# Patient Record
Sex: Female | Born: 2000 | Race: White | Hispanic: No | Marital: Single | Smoking: Never smoker
Health system: Southern US, Community
[De-identification: ages and names within clinical notes are randomized; demographics above are authoritative.]

## PROBLEM LIST (undated history)

## (undated) DIAGNOSIS — F419 Anxiety disorder, unspecified: Secondary | ICD-10-CM

## (undated) HISTORY — DX: Anxiety disorder, unspecified: F41.9

---

## 2020-12-22 ENCOUNTER — Emergency Department
Admission: EM | Admit: 2020-12-22 | Discharge: 2020-12-22 | Disposition: A | Discharge status: Home / Self Care | Payer: Self-pay | Attending: Emergency Medicine | Admitting: Emergency Medicine

## 2020-12-22 ENCOUNTER — Encounter: Payer: Self-pay | Admitting: Emergency Medicine

## 2020-12-22 ENCOUNTER — Other Ambulatory Visit: Payer: Self-pay

## 2020-12-22 DIAGNOSIS — R197 Diarrhea, unspecified: Secondary | ICD-10-CM

## 2020-12-22 DIAGNOSIS — F12188 Cannabis abuse with other cannabis-induced disorder: Secondary | ICD-10-CM | POA: Insufficient documentation

## 2020-12-22 DIAGNOSIS — F1290 Cannabis use, unspecified, uncomplicated: Secondary | ICD-10-CM

## 2020-12-22 DIAGNOSIS — E860 Dehydration: Secondary | ICD-10-CM | POA: Insufficient documentation

## 2020-12-22 DIAGNOSIS — R112 Nausea with vomiting, unspecified: Secondary | ICD-10-CM

## 2020-12-22 LAB — COMPREHENSIVE METABOLIC PANEL
ALT: 14 U/L (ref 0–44)
AST: 25 U/L (ref 15–41)
Albumin: 4.4 g/dL (ref 3.5–5.0)
Alkaline Phosphatase: 33 U/L — ABNORMAL LOW (ref 38–126)
Anion gap: 12 (ref 5–15)
BUN: 16 mg/dL (ref 6–20)
CO2: 20 mmol/L — ABNORMAL LOW (ref 22–32)
Calcium: 9.4 mg/dL (ref 8.9–10.3)
Chloride: 103 mmol/L (ref 98–111)
Creatinine, Ser: 0.78 mg/dL (ref 0.44–1.00)
GFR, Estimated: >60 mL/min (ref >60)
Glucose, Bld: 176 mg/dL — ABNORMAL HIGH (ref 70–99)
Potassium: 4 mmol/L (ref 3.5–5.1)
Sodium: 135 mmol/L (ref 135–145)
Total Bilirubin: 1.2 mg/dL (ref 0.3–1.2)
Total Protein: 8.3 g/dL — ABNORMAL HIGH (ref 6.5–8.1)

## 2020-12-22 LAB — CBC WITH DIFFERENTIAL/PLATELET
Abs Immature Granulocytes: 0.03 10*3/uL (ref 0.00–0.07)
Basophils Absolute: 0 10*3/uL (ref 0.0–0.1)
Basophils Relative: 0 %
Eosinophils Absolute: 0 10*3/uL (ref 0.0–0.5)
Eosinophils Relative: 0 %
HCT: 39.6 % (ref 36.0–46.0)
Hemoglobin: 14 g/dL (ref 12.0–15.0)
Immature Granulocytes: 0 %
Lymphocytes Relative: 4 %
Lymphs Abs: 0.4 10*3/uL — ABNORMAL LOW (ref 0.7–4.0)
MCH: 33.2 pg (ref 26.0–34.0)
MCHC: 35.4 g/dL (ref 30.0–36.0)
MCV: 93.8 fL (ref 80.0–100.0)
Monocytes Absolute: 0.3 10*3/uL (ref 0.1–1.0)
Monocytes Relative: 3 %
Neutro Abs: 10.3 10*3/uL — ABNORMAL HIGH (ref 1.7–7.7)
Neutrophils Relative %: 93 %
Platelets: 298 10*3/uL (ref 150–400)
RBC: 4.22 MIL/uL (ref 3.87–5.11)
RDW: 13.1 % (ref 11.5–15.5)
WBC: 11.1 10*3/uL — ABNORMAL HIGH (ref 4.0–10.5)
nRBC: 0 % (ref 0.0–0.2)

## 2020-12-22 LAB — URINALYSIS, COMPLETE (UACMP) WITH MICROSCOPIC
Bilirubin Urine: NEGATIVE
Glucose, UA: >=500 mg/dL — AB
Hgb urine dipstick: NEGATIVE
Ketones, ur: 80 mg/dL — AB
Leukocytes,Ua: NEGATIVE
Nitrite: NEGATIVE
Protein, ur: 30 mg/dL — AB
Specific Gravity, Urine: 1.028 (ref 1.005–1.030)
pH: 8 (ref 5.0–8.0)

## 2020-12-22 LAB — POC URINE PREG, ED: Preg Test, Ur: NEGATIVE

## 2020-12-22 LAB — LIPASE, BLOOD: Lipase: 22 U/L (ref 11–51)

## 2020-12-22 MED ORDER — ONDANSETRON HCL 4 MG/2ML IJ SOLN: 4.0000 mg | Freq: Once | INTRAMUSCULAR | Status: DC

## 2020-12-22 MED ORDER — ONDANSETRON 4 MG PO TBDP: 4.0000 mg | ORAL_TABLET | Freq: Three times a day (TID) | ORAL | 0 refills | Status: DC | PRN

## 2020-12-22 MED ORDER — LACTATED RINGERS IV BOLUS
1000.0000 mL | Freq: Once | INTRAVENOUS | Status: AC
  Administered 2020-12-22: 1000 mL via INTRAVENOUS

## 2020-12-22 MED ORDER — DROPERIDOL 2.5 MG/ML IJ SOLN
2.5000 mg | Freq: Once | INTRAMUSCULAR | Status: AC
  Administered 2020-12-22: 2.5 mg via INTRAVENOUS
  Filled 2020-12-22: qty 2

## 2020-12-22 NOTE — ED Triage Notes (Signed)
Pt reports NVD that started last pm and abd pain to the center of her abd. Pt reports can't keep anything down and the pain is worse when she vomits.

## 2020-12-22 NOTE — ED Notes (Signed)
Pt tolerating oral fluids 

## 2020-12-22 NOTE — ED Provider Notes (Signed)
[facility] Emergency Department Provider Note ____________________________________________   Event Date/Time   First MD Initiated Contact with Patient 12/22/20 [phone]     (approximate)  I have reviewed the triage vital signs and the nursing notes.  HISTORY  Chief Complaint Nausea, Emesis, Diarrhea, and Abdominal Pain   HPI Sara Davenport is a 20 y.o. femalewho presents to the ED for evaluation of abdominal pain, nausea, vomiting and diarrhea.  Chart review indicates patient was seen for similar presentation about 2 months ago at a neighboring ED and diagnosed with cannabis hyperemesis syndrome.  Patient presents to the ED from home via POV for evaluation of nausea, vomiting and diarrhea that started last night.  She reports eating multiple meals during the day yesterday without difficulty, abdominal pain or emesis postprandially.  She reports going out with friends last night, denies drinking alcohol, but does report smoking cannabis.  She reports developing nausea and abdominal cramping shortly after this and recurrent emesis throughout the night.  She reports developing watery diarrhea overnight as well at about 2 AM with a couple episodes, but she reports primarily emesis.  Denies fevers, dysuria, syncope, chest pain, shortness of breath, vaginal discharge or bleeding.  History reviewed. No pertinent past medical history.  There are no problems to display for this patient.   History reviewed. No pertinent surgical history.  Prior to Admission medications   Medication Sig Start Date End Date Taking? Authorizing Provider  ondansetron (ZOFRAN ODT) 4 MG disintegrating tablet Take 1 tablet (4 mg total) by mouth every 8 (eight) hours as needed for nausea or vomiting. 12/22/20  Yes Delton Prairie, MD    Allergies Patient has no allergy information on record.  No family history on file.  Social History    Review of Systems  Constitutional: No  fever/chills Eyes: No visual changes. ENT: No sore throat. Cardiovascular: Denies chest pain. Respiratory: Denies shortness of breath. Gastrointestinal: Abdominal pain, nausea, vomiting and diarrhea   No constipation. Genitourinary: Negative for dysuria. Musculoskeletal: Negative for back pain. Skin: Negative for rash. Neurological: Negative for headaches, focal weakness or numbness.  ____________________________________________   PHYSICAL EXAM:  VITAL SIGNS: Vitals:   12/22/20 0930 12/22/20 1008  BP: (!) 138/93 129/73  Pulse:  75  Resp: (!) 22 18  Temp:    SpO2:  100%     Constitutional: Alert and oriented.  Petite.  Frequently dry heaving and actively vomiting in front of me into an emesis bag with nonbloody nonbilious emesis. Eyes: Conjunctivae are normal. PERRL. EOMI. Head: Atraumatic. Nose: No congestion/rhinnorhea. Mouth/Throat: Mucous membranes are dry.  Oropharynx non-erythematous. Neck: No stridor. No cervical spine tenderness to palpation. Cardiovascular: Tachycardic rate, regular rhythm. Grossly normal heart sounds.  Good peripheral circulation. Respiratory: Normal respiratory effort.  No retractions. Lungs CTAB. Gastrointestinal: Soft , nondistended. No CVA tenderness. Upon my initial examination, she has voluntary guarding throughout making examination difficult.  I do not appreciate peritoneal features. Upon reexamination after medication, she has a benign abdomen throughout that is soft and nontender. Musculoskeletal: No lower extremity tenderness nor edema.  No joint effusions. No signs of acute trauma. Neurologic:  Normal speech and language. No gross focal neurologic deficits are appreciated. No gait instability noted. Skin:  Skin is warm, dry and intact. No rash noted. Psychiatric: Mood and affect are normal. Speech and behavior are normal.  ____________________________________________   LABS (all labs ordered are listed, but only abnormal results are  displayed)  Labs Reviewed  URINALYSIS, COMPLETE (UACMP) WITH MICROSCOPIC -  Abnormal; Notable for the following components:      Result Value   Color, Urine YELLOW (*)    APPearance CLEAR (*)    Glucose, UA >=500 (*)    Ketones, ur 80 (*)    Protein, ur 30 (*)    Bacteria, UA RARE (*)    All other components within normal limits  CBC WITH DIFFERENTIAL/PLATELET - Abnormal; Notable for the following components:   WBC 11.1 (*)    Neutro Abs 10.3 (*)    Lymphs Abs 0.4 (*)    All other components within normal limits  COMPREHENSIVE METABOLIC PANEL - Abnormal; Notable for the following components:   CO2 20 (*)    Glucose, Bld 176 (*)    Total Protein 8.3 (*)    Alkaline Phosphatase 33 (*)    All other components within normal limits  LIPASE, BLOOD  POC URINE PREG, ED   ____________________________________________   PROCEDURES and INTERVENTIONS  Procedure(s) performed (including Critical Care):  .1-3 Lead EKG Interpretation Performed by: Delton Prairie, MD Authorized by: Delton Prairie, MD     Interpretation: abnormal     ECG rate:  110   ECG rate assessment: tachycardic     Rhythm: sinus tachycardia     Ectopy: none     Conduction: normal      Medications  lactated ringers bolus 1,000 mL (0 mLs Intravenous Stopped 12/22/20 1019)  droperidol (INAPSINE) 2.5 MG/ML injection 2.5 mg (2.5 mg Intravenous Given 12/22/20 0832)    ____________________________________________   MDM / ED COURSE   20 year old female presents to the ED with acute nausea, vomiting and diarrhea with evidence of cannabis hyperemesis syndrome and amenable to outpatient management.  Presents tachycardic but hemodynamically stable.  Exam demonstrates a patient actively heaving nonbloody nonbilious emesis.  Abdominal exam is difficult due to guarding.  She appears dry.  Provided empiric droperidol and IV fluids, due to her history of cannabis use and previous ED presentation for the same, with good control of her  symptoms.  Subsequent abdominal exam is benign and tachycardia resolves.  Work-up with evidence of dehydration, but without evidence of acute cystitis, sepsis, and no abdominal tenderness to suggest acute appendicitis or cholecystitis or other intra-abdominal surgical pathology.  She is tolerating p.o. intake and return precautions for the ED were discussed.  Provided Zofran prescription and recommendations for cannabis cessation.   Clinical Course as of 12/22/20 1047  Sat Dec 22, 2020  0859 Reassessed.  Patient reports improving symptoms.  Reexamination of her abdomen reveals benign exam.  Tachycardia has resolved.  Fluids ongoing.  We discussed p.o. challenge because she is requesting food, and I think this is reasonable as her blood work is reassuring so far and I do not suspect surgical pathology.  We discussed need for urine sample and she is agreeable. [DS]  1856 Reassessed.  Continues to look well and is tolerating intake of liquids.  Not tachycardic and continues to have a benign abdominal exam.  We discussed the likelihood of cannabis hyperemesis syndrome versus viral gastroenteritis.  We discussed cessation from cannabis.  We discussed hot showers and Zofran for any further episodes of cannabis hyperemesis syndrome.  We discussed return precautions for the ED.  Answered questions. [DS]    Clinical Course User Index [DS] Delton Prairie, MD    ____________________________________________   FINAL CLINICAL IMPRESSION(S) / ED DIAGNOSES  Final diagnoses:  Nausea vomiting and diarrhea  Dehydration  Cannabinoid hyperemesis syndrome     ED Discharge Orders  Ordered    ondansetron (ZOFRAN ODT) 4 MG disintegrating tablet  Every 8 hours PRN        12/22/20 0908           Edvardo Honse   Note:  This document was prepared using Dragon voice recognition software and may include unintentional dictation errors.   Delton Prairie, MD 12/22/20 1049

## 2020-12-22 NOTE — Discharge Instructions (Addendum)
I suspect your symptoms may be due to your cannabis smoking. These episodes will likely continue to happen if you continue to smoke.   If something like this happens again, try Zofran nausea medicine and a hot shower. If this is unsuccessful, return to the ED.   If you develop any fevers and vomiting, abdominal pain and vomiting, or other uncontrolled symptoms, please return to the ED.

## 2021-09-09 ENCOUNTER — Other Ambulatory Visit: Payer: Self-pay

## 2021-09-09 ENCOUNTER — Emergency Department (HOSPITAL_COMMUNITY)
Admission: EM | Admit: 2021-09-09 | Discharge: 2021-09-09 | Disposition: A | Discharge status: Home / Self Care | Payer: Self-pay | Attending: Emergency Medicine | Admitting: Emergency Medicine

## 2021-09-09 ENCOUNTER — Encounter (HOSPITAL_COMMUNITY): Payer: Self-pay

## 2021-09-09 DIAGNOSIS — Z5321 Procedure and treatment not carried out due to patient leaving prior to being seen by health care provider: Secondary | ICD-10-CM | POA: Insufficient documentation

## 2021-09-09 DIAGNOSIS — R197 Diarrhea, unspecified: Secondary | ICD-10-CM | POA: Insufficient documentation

## 2021-09-09 DIAGNOSIS — R112 Nausea with vomiting, unspecified: Secondary | ICD-10-CM | POA: Insufficient documentation

## 2021-09-09 DIAGNOSIS — R1084 Generalized abdominal pain: Secondary | ICD-10-CM | POA: Insufficient documentation

## 2021-09-09 MED ORDER — ONDANSETRON 4 MG PO TBDP
4.0000 mg | ORAL_TABLET | Freq: Once | ORAL | Status: AC | PRN
  Administered 2021-09-09: 18:00:00 4 mg via ORAL
  Filled 2021-09-09: qty 1

## 2021-09-09 MED ORDER — ONDANSETRON 4 MG PO TBDP
4.0000 mg | ORAL_TABLET | Freq: Once | ORAL | Status: AC
  Administered 2021-09-09: 19:00:00 4 mg via ORAL
  Filled 2021-09-09: qty 1

## 2021-09-09 NOTE — ED Provider Notes (Signed)
Emergency Medicine Provider Triage Evaluation Note  Sara Davenport , a 20 y.o. female  was evaluated in triage.  Pt complains of nausea vomiting, diarrhea, and generalized abdominal pain.  Symptoms started today at 1130.  Patient unable to identify having time she is thrown up with this time.  States that emesis looks like bilious and brown.  Denies any coffee-ground emesis or hematemesis.  Endorses alcohol and drug use.  Review of Systems  Positive: Chills, nausea, vomiting, diarrhea, generalized abdominal pain Negative: Dysuria, hematuria, urinary urgency, vaginal pain, vaginal bleeding, vaginal discharge, fever  Physical Exam  BP (!) 148/84 (BP Location: Left Arm)    Pulse 68    Temp 97.9 F (36.6 C) (Oral)    Resp 18    Ht 5\' 2"  (1.575 m)    Wt 45.4 kg    LMP 08/13/2021    BMI 18.29 kg/m  Gen:   Awake, no distress   Resp:  Normal effort  MSK:   Moves extremities without difficulty  Other:  Abdomen soft, nondistended, nontender, no rebound tenderness or guarding.  Medical Decision Making  Medically screening exam initiated at 6:43 PM.  Appropriate orders placed.  Sara Davenport was informed that the remainder of the evaluation will be completed by another provider, this initial triage assessment does not replace that evaluation, and the importance of remaining in the ED until their evaluation is complete.     Collier Bullock, PA-C 09/09/21 1844    09/11/21, DO 09/11/21 [phone]

## 2021-09-09 NOTE — ED Triage Notes (Addendum)
Patient c/o generalized abdominal pain and N/V/D since 1130 today.  Patient has been drinking water in the lobby.

## 2021-12-21 ENCOUNTER — Encounter (HOSPITAL_COMMUNITY): Payer: Self-pay

## 2021-12-21 ENCOUNTER — Other Ambulatory Visit: Payer: Self-pay

## 2021-12-21 ENCOUNTER — Emergency Department (HOSPITAL_COMMUNITY): Payer: Self-pay

## 2021-12-21 ENCOUNTER — Emergency Department (HOSPITAL_COMMUNITY)
Admission: EM | Admit: 2021-12-21 | Discharge: 2021-12-21 | Disposition: A | Discharge status: Home / Self Care | Payer: Self-pay | Attending: Emergency Medicine | Admitting: Emergency Medicine

## 2021-12-21 DIAGNOSIS — R112 Nausea with vomiting, unspecified: Secondary | ICD-10-CM | POA: Insufficient documentation

## 2021-12-21 DIAGNOSIS — R1030 Lower abdominal pain, unspecified: Secondary | ICD-10-CM | POA: Insufficient documentation

## 2021-12-21 LAB — COMPREHENSIVE METABOLIC PANEL
ALT: 22 U/L (ref 0–44)
AST: 27 U/L (ref 15–41)
Albumin: 4 g/dL (ref 3.5–5.0)
Alkaline Phosphatase: 34 U/L — ABNORMAL LOW (ref 38–126)
Anion gap: 11 (ref 5–15)
BUN: 14 mg/dL (ref 6–20)
CO2: 24 mmol/L (ref 22–32)
Calcium: 8.9 mg/dL (ref 8.9–10.3)
Chloride: 103 mmol/L (ref 98–111)
Creatinine, Ser: 0.78 mg/dL (ref 0.44–1.00)
GFR, Estimated: >60 mL/min (ref >60)
Glucose, Bld: 102 mg/dL — ABNORMAL HIGH (ref 70–99)
Potassium: 3.1 mmol/L — ABNORMAL LOW (ref 3.5–5.1)
Sodium: 138 mmol/L (ref 135–145)
Total Bilirubin: 0.4 mg/dL (ref 0.3–1.2)
Total Protein: 7.5 g/dL (ref 6.5–8.1)

## 2021-12-21 LAB — URINALYSIS, ROUTINE W REFLEX MICROSCOPIC
Bilirubin Urine: NEGATIVE
Glucose, UA: NEGATIVE mg/dL
Ketones, ur: 80 mg/dL — AB
Leukocytes,Ua: NEGATIVE
Nitrite: NEGATIVE
Protein, ur: 30 mg/dL — AB
Specific Gravity, Urine: 1.028 (ref 1.005–1.030)
pH: 5 (ref 5.0–8.0)

## 2021-12-21 LAB — CBC
HCT: 41.7 % (ref 36.0–46.0)
Hemoglobin: 14.2 g/dL (ref 12.0–15.0)
MCH: 32.6 pg (ref 26.0–34.0)
MCHC: 34.1 g/dL (ref 30.0–36.0)
MCV: 95.6 fL (ref 80.0–100.0)
Platelets: 264 10*3/uL (ref 150–400)
RBC: 4.36 MIL/uL (ref 3.87–5.11)
RDW: 12.6 % (ref 11.5–15.5)
WBC: 10.4 10*3/uL (ref 4.0–10.5)
nRBC: 0 % (ref 0.0–0.2)

## 2021-12-21 LAB — POC URINE PREG, ED: Preg Test, Ur: NEGATIVE

## 2021-12-21 LAB — LIPASE, BLOOD: Lipase: 25 U/L (ref 11–51)

## 2021-12-21 MED ORDER — PANTOPRAZOLE SODIUM 40 MG IV SOLR
40.0000 mg | Freq: Once | INTRAVENOUS | Status: AC
  Administered 2021-12-21: 40 mg via INTRAVENOUS
  Filled 2021-12-21: qty 10

## 2021-12-21 MED ORDER — PANTOPRAZOLE SODIUM 20 MG PO TBEC: 20.0000 mg | DELAYED_RELEASE_TABLET | Freq: Every day | ORAL | 0 refills | Status: AC

## 2021-12-21 MED ORDER — SODIUM CHLORIDE 0.9 % IV BOLUS
1000.0000 mL | Freq: Once | INTRAVENOUS | Status: AC
  Administered 2021-12-21: 1000 mL via INTRAVENOUS

## 2021-12-21 MED ORDER — IOHEXOL 300 MG/ML  SOLN
75.0000 mL | Freq: Once | INTRAMUSCULAR | Status: AC | PRN
  Administered 2021-12-21: 75 mL via INTRAVENOUS

## 2021-12-21 MED ORDER — METOCLOPRAMIDE HCL 5 MG/ML IJ SOLN
10.0000 mg | Freq: Once | INTRAMUSCULAR | Status: AC
  Administered 2021-12-21: 10 mg via INTRAVENOUS
  Filled 2021-12-21: qty 2

## 2021-12-21 MED ORDER — PROMETHAZINE HCL 25 MG RE SUPP: 25.0000 mg | Freq: Four times a day (QID) | RECTAL | 1 refills | Status: DC | PRN

## 2021-12-21 MED ORDER — PROCHLORPERAZINE EDISYLATE 10 MG/2ML IJ SOLN
10.0000 mg | Freq: Once | INTRAMUSCULAR | Status: AC
  Administered 2021-12-21: 10 mg via INTRAVENOUS
  Filled 2021-12-21: qty 2

## 2021-12-21 NOTE — ED Provider Notes (Signed)
?[facility] ?Provider Note ? ? ?CSN: [id number] ?Arrival date & time: 12/21/21  2035 ? ?  ? ?History ? ?Chief Complaint  ?Patient presents with  ? Emesis  ? ? ?Sara Davenport is a 21 y.o. female. ? ?Patient with nausea vomiting and lower abdominal discomfort.  Patient has a history of persistent vomiting before. ? ?The history is provided by the patient and medical records. No language interpreter was used.  ?Emesis ?Severity:  Moderate ?Timing:  Constant ?Quality:  Bilious material ?Able to tolerate:  Liquids ?Progression:  Worsening ?Chronicity:  Recurrent ?Recent urination:  Normal ?Relieved by:  Nothing ?Associated symptoms: no abdominal pain, no cough, no diarrhea and no headaches   ? ?  ? ?Home Medications ?Prior to Admission medications   ?Medication Sig Start Date End Date Taking? Authorizing Provider  ?pantoprazole (PROTONIX) 20 MG tablet Take 1 tablet (20 mg total) by mouth daily. 12/21/21  Yes Bethann BerkshireZammit, Christe Tellez, MD  ?promethazine (PHENERGAN) 25 MG suppository Place 1 suppository (25 mg total) rectally every 6 (six) hours as needed for nausea or vomiting. 12/21/21  Yes Bethann BerkshireZammit, Katya Rolston, MD  ?ondansetron (ZOFRAN ODT) 4 MG disintegrating tablet Take 1 tablet (4 mg total) by mouth every 8 (eight) hours as needed for nausea or vomiting. 12/22/20   Delton PrairieSmith, Dylan, MD  ?   ? ?Allergies    ?Patient has no known allergies.   ? ?Review of Systems   ?Review of Systems  ?Constitutional:  Negative for appetite change and fatigue.  ?HENT:  Negative for congestion, ear discharge and sinus pressure.   ?Eyes:  Negative for discharge.  ?Respiratory:  Negative for cough.   ?Cardiovascular:  Negative for chest pain.  ?Gastrointestinal:  Positive for vomiting. Negative for abdominal pain and diarrhea.  ?Genitourinary:  Negative for frequency and hematuria.  ?Musculoskeletal:  Negative for back pain.  ?Skin:  Negative for rash.  ?Neurological:  Negative for seizures and headaches.  ?Psychiatric/Behavioral:  Negative for  hallucinations.   ? ?Physical Exam ?Updated Vital Signs ?BP 116/72   Pulse 81   Temp 98.2 ?F (36.8 ?C) (Oral)   Resp 17   Ht 5\' 2"  (1.575 m)   Wt 39.9 kg   LMP 12/21/2021 (Exact Date)   SpO2 99%   BMI 16.10 kg/m?  ?Physical Exam ?Vitals and nursing note reviewed.  ?Constitutional:   ?   Appearance: She is well-developed.  ?HENT:  ?   Head: Normocephalic.  ?   Comments: Dry mucous membrane ?   Nose: Nose normal.  ?Eyes:  ?   General: No scleral icterus. ?   Conjunctiva/sclera: Conjunctivae normal.  ?Neck:  ?   Thyroid: No thyromegaly.  ?Cardiovascular:  ?   Rate and Rhythm: Normal rate and regular rhythm.  ?   Heart sounds: No murmur heard. ?  No friction rub. No gallop.  ?Pulmonary:  ?   Breath sounds: No stridor. No wheezing or rales.  ?Chest:  ?   Chest wall: No tenderness.  ?Abdominal:  ?   General: There is no distension.  ?   Tenderness: There is no abdominal tenderness. There is no rebound.  ?   Comments: Moderate tenderness  ?Musculoskeletal:     ?   General: Normal range of motion.  ?   Cervical back: Neck supple.  ?Lymphadenopathy:  ?   Cervical: No cervical adenopathy.  ?Skin: ?   Findings: No erythema or rash.  ?Neurological:  ?   Mental Status: She is alert and oriented to person,  place, and time.  ?   Motor: No abnormal muscle tone.  ?   Coordination: Coordination normal.  ?Psychiatric:     ?   Behavior: Behavior normal.  ? ? ?ED Results / Procedures / Treatments   ?Labs ?(all labs ordered are listed, but only abnormal results are displayed) ?Labs Reviewed  ?COMPREHENSIVE METABOLIC PANEL - Abnormal; Notable for the following components:  ?    Result Value  ? Potassium 3.1 (*)   ? Glucose, Bld 102 (*)   ? Alkaline Phosphatase 34 (*)   ? All other components within normal limits  ?URINALYSIS, ROUTINE W REFLEX MICROSCOPIC - Abnormal; Notable for the following components:  ? Color, Urine AMBER (*)   ? Hgb urine dipstick MODERATE (*)   ? Ketones, ur 80 (*)   ? Protein, ur 30 (*)   ? Bacteria, UA RARE  (*)   ? All other components within normal limits  ?LIPASE, BLOOD  ?CBC  ?POC URINE PREG, ED  ? ? ?EKG ?None ? ?Radiology ?CT ABDOMEN PELVIS W CONTRAST ? ?Result Date: 12/21/2021 ?CLINICAL DATA:  Abdominal pain, acute, nonlocalized EXAM: CT ABDOMEN AND PELVIS WITH CONTRAST TECHNIQUE: Multidetector CT imaging of the abdomen and pelvis was performed using the standard protocol following bolus administration of intravenous contrast. RADIATION DOSE REDUCTION: This exam was performed according to the departmental dose-optimization program which includes automated exposure control, adjustment of the mA and/or kV according to patient size and/or use of iterative reconstruction technique. CONTRAST:  51mL OMNIPAQUE IOHEXOL 300 MG/ML  SOLN COMPARISON:  None. FINDINGS: Lower chest: No acute abnormality. Hepatobiliary: No focal liver abnormality. No gallstones, gallbladder wall thickening, or pericholecystic fluid. No biliary dilatation. Pancreas: No focal lesion. Normal pancreatic contour. No surrounding inflammatory changes. No main pancreatic ductal dilatation. Spleen: Normal in size without focal abnormality. Adrenals/Urinary Tract: No adrenal nodule bilaterally. Bilateral kidneys enhance symmetrically. No hydronephrosis. No hydroureter.  No nephroureterolithiasis. The urinary bladder is unremarkable. Stomach/Bowel: Stomach is within normal limits. No evidence of bowel wall thickening or dilatation. The appendix is not definitely identified with no inflammatory changes in the right lower quadrant to suggest acute appendicitis. Vascular/Lymphatic: No abdominal aorta or iliac aneurysm. No abdominal, pelvic, or inguinal lymphadenopathy. Reproductive: Uterus and bilateral adnexa are unremarkable. Other: No intraperitoneal free fluid. No intraperitoneal free gas. No organized fluid collection. Musculoskeletal: No abdominal wall hernia or abnormality. No suspicious lytic or blastic osseous lesions. No acute displaced fracture.  IMPRESSION: The appendix is not definitely identified with no inflammatory changes in the right lower quadrant to suggest acute appendicitis. No acute intra-abdominal or intrapelvic abnormality. Electronically Signed   By: Tish Frederickson M.D.   On: 12/21/2021 23:09   ? ?Procedures ?Procedures  ? ? ?Medications Ordered in ED ?Medications  ?sodium chloride 0.9 % bolus 1,000 mL (0 mLs Intravenous Stopped 12/21/21 2224)  ?prochlorperazine (COMPAZINE) injection 10 mg (10 mg Intravenous Given 12/21/21 2134)  ?pantoprazole (PROTONIX) injection 40 mg (40 mg Intravenous Given 12/21/21 2230)  ?iohexol (OMNIPAQUE) 300 MG/ML solution 75 mL (75 mLs Intravenous Contrast Given 12/21/21 2240)  ?sodium chloride 0.9 % bolus 1,000 mL (1,000 mLs Intravenous New Bag/Given 12/21/21 2230)  ?metoCLOPramide (REGLAN) injection 10 mg (10 mg Intravenous Given 12/21/21 2232)  ? ? ?ED Course/ Medical Decision Making/ A&P ?Labs unremarkable.  Patient improved with Compazine and fluids.  She was also given Protonix. ?                        ?  Medical Decision Making ?Amount and/or Complexity of Data Reviewed ?Labs: ordered. ?Radiology: ordered. ? ?Risk ?Prescription drug management. ? ?This patient presents to the ED for concern of vomiting, this involves an extensive number of treatment options, and is a complaint that carries with it a high risk of complications and morbidity.  The differential diagnosis includes gastritis, appendicitis ? ? ?Co morbidities that complicate the patient evaluation ? ?None ? ? ?Additional history obtained: ? ?Additional history obtained from patient and significant other ?External records from outside source obtained and reviewed including hospital record ? ? ?Lab Tests: ? ?I Ordered, and personally interpreted labs.  The pertinent results include: CBC and chemistries that showed mild low potassium at 3.1, and CBC normal ? ? ?Imaging Studies ordered: ? ?I ordered imaging studies including CT abdomen ?I independently visualized  and interpreted imaging which showed no acute disease ?I agree with the radiologist interpretation ? ? ?Cardiac Monitoring: / EKG: ? ?The patient was maintained on a cardiac monitor.  I personally viewed and inte

## 2021-12-21 NOTE — Discharge Instructions (Signed)
Drink plenty of fluids.  Follow-up with Dr. Marletta Lor or one of his associates next week.  Return if any problem ?

## 2021-12-21 NOTE — ED Notes (Signed)
Pt has ice chips that was given by another staff member- pt was instructed by this nurse upon her arrival not to have anything by mouth until we got her nausea under control- ice chips were taken away as pt continues to have nausea and requesting more medication- Dr Roderic Palau made aware- new orders received.  ?

## 2021-12-21 NOTE — ED Triage Notes (Signed)
Pt states she has been vomiting since Wednesday and was seen at [facility] yesterday and was dx with a UTI. Pt is unable to keep anything down and thinks she is dehydrated.  ?

## 2022-01-23 ENCOUNTER — Emergency Department (HOSPITAL_COMMUNITY)
Admission: EM | Admit: 2022-01-23 | Discharge: 2022-01-23 | Disposition: A | Discharge status: Home / Self Care | Payer: Self-pay

## 2022-01-24 ENCOUNTER — Other Ambulatory Visit: Payer: Self-pay

## 2022-01-24 ENCOUNTER — Emergency Department (HOSPITAL_COMMUNITY)
Admission: EM | Admit: 2022-01-24 | Discharge: 2022-01-24 | Disposition: A | Discharge status: Home / Self Care | Payer: Self-pay | Attending: Emergency Medicine | Admitting: Emergency Medicine

## 2022-01-24 ENCOUNTER — Encounter (HOSPITAL_COMMUNITY): Payer: Self-pay | Admitting: Emergency Medicine

## 2022-01-24 DIAGNOSIS — R112 Nausea with vomiting, unspecified: Secondary | ICD-10-CM | POA: Insufficient documentation

## 2022-01-24 DIAGNOSIS — E876 Hypokalemia: Secondary | ICD-10-CM | POA: Insufficient documentation

## 2022-01-24 LAB — COMPREHENSIVE METABOLIC PANEL
ALT: 23 U/L (ref 0–44)
AST: 31 U/L (ref 15–41)
Albumin: 5.4 g/dL — ABNORMAL HIGH (ref 3.5–5.0)
Alkaline Phosphatase: 41 U/L (ref 38–126)
Anion gap: 17 — ABNORMAL HIGH (ref 5–15)
BUN: 15 mg/dL (ref 6–20)
CO2: 23 mmol/L (ref 22–32)
Calcium: 9.7 mg/dL (ref 8.9–10.3)
Chloride: 96 mmol/L — ABNORMAL LOW (ref 98–111)
Creatinine, Ser: 0.84 mg/dL (ref 0.44–1.00)
GFR, Estimated: >60 mL/min (ref >60)
Glucose, Bld: 81 mg/dL (ref 70–99)
Potassium: 3.2 mmol/L — ABNORMAL LOW (ref 3.5–5.1)
Sodium: 136 mmol/L (ref 135–145)
Total Bilirubin: 1.5 mg/dL — ABNORMAL HIGH (ref 0.3–1.2)
Total Protein: 9.7 g/dL — ABNORMAL HIGH (ref 6.5–8.1)

## 2022-01-24 LAB — CBC WITH DIFFERENTIAL/PLATELET
Abs Immature Granulocytes: 0.03 10*3/uL (ref 0.00–0.07)
Basophils Absolute: 0 10*3/uL (ref 0.0–0.1)
Basophils Relative: 0 %
Eosinophils Absolute: 0 10*3/uL (ref 0.0–0.5)
Eosinophils Relative: 0 %
HCT: 46.7 % — ABNORMAL HIGH (ref 36.0–46.0)
Hemoglobin: 16.2 g/dL — ABNORMAL HIGH (ref 12.0–15.0)
Immature Granulocytes: 0 %
Lymphocytes Relative: 13 %
Lymphs Abs: 1.1 10*3/uL (ref 0.7–4.0)
MCH: 33.3 pg (ref 26.0–34.0)
MCHC: 34.7 g/dL (ref 30.0–36.0)
MCV: 95.9 fL (ref 80.0–100.0)
Monocytes Absolute: 0.5 10*3/uL (ref 0.1–1.0)
Monocytes Relative: 6 %
Neutro Abs: 6.6 10*3/uL (ref 1.7–7.7)
Neutrophils Relative %: 81 %
Platelets: 272 10*3/uL (ref 150–400)
RBC: 4.87 MIL/uL (ref 3.87–5.11)
RDW: 13.1 % (ref 11.5–15.5)
WBC: 8.3 10*3/uL (ref 4.0–10.5)
nRBC: 0 % (ref 0.0–0.2)

## 2022-01-24 LAB — URINALYSIS, ROUTINE W REFLEX MICROSCOPIC
Bacteria, UA: NONE SEEN
Bilirubin Urine: NEGATIVE
Glucose, UA: NEGATIVE mg/dL
Hgb urine dipstick: NEGATIVE
Ketones, ur: 80 mg/dL — AB
Leukocytes,Ua: NEGATIVE
Nitrite: NEGATIVE
Protein, ur: 100 mg/dL — AB
Specific Gravity, Urine: 1.032 — ABNORMAL HIGH (ref 1.005–1.030)
Squamous Epithelial / HPF: >50 — ABNORMAL HIGH (ref 0–5)
pH: 6 (ref 5.0–8.0)

## 2022-01-24 LAB — PREGNANCY, URINE: Preg Test, Ur: NEGATIVE

## 2022-01-24 MED ORDER — ONDANSETRON 8 MG PO TBDP
8.0000 mg | ORAL_TABLET | Freq: Three times a day (TID) | ORAL | 0 refills | Status: AC | PRN
Start: 2022-01-24 — End: ?

## 2022-01-24 MED ORDER — ONDANSETRON 4 MG PO TBDP
4.0000 mg | ORAL_TABLET | Freq: Once | ORAL | Status: AC
Start: 2022-01-24 — End: 2022-01-24
  Administered 2022-01-24: 4 mg via ORAL
  Filled 2022-01-24: qty 1

## 2022-01-24 MED ORDER — SODIUM CHLORIDE 0.9 % IV BOLUS
1000.0000 mL | Freq: Once | INTRAVENOUS | Status: AC
Start: 2022-01-24 — End: 2022-01-24
  Administered 2022-01-24: 1000 mL via INTRAVENOUS

## 2022-01-24 MED ORDER — METOCLOPRAMIDE HCL 5 MG/ML IJ SOLN
10.0000 mg | Freq: Once | INTRAMUSCULAR | Status: AC
  Administered 2022-01-24: 10 mg via INTRAVENOUS
  Filled 2022-01-24: qty 2

## 2022-01-24 NOTE — ED Triage Notes (Signed)
Pt c/o on and off vomiting x 3 days. States her aunt has similar symptoms. Denies diarrhea. Pt c/o generalized abdominal pain that worsens with vomiting.  ?

## 2022-01-24 NOTE — ED Provider Notes (Signed)
?[facility] ?Provider Note ? ? ?CSN: [id number] ?Arrival date & time: 01/24/22  1028 ? ?  ? ?History ? ?Chief Complaint  ?Patient presents with  ? Vomiting  ? ? ?Sara Davenport is a 21 y.o. female. ? ?HPI ? ?  ? ? ?Sara Davenport is a 21 y.o. female who presents to the Emergency Department complaining of intermittent vomiting x3 days.  States that she has history of similar symptoms.  She was seen by her PCP and started on antianxiety medications.  Patient says she does not like taking them.  Onset of her current symptoms began after receiving a infraction by her supervisor.  Vomiting began shortly after.  She has had waxing and waning symptoms since then.  She describes having diffuse crampy abdominal pain associated with vomiting.  She denies fever, chills, diarrhea, hematemesis or urinary symptoms. ? ? ?Home Medications ?Prior to Admission medications   ?Medication Sig Start Date End Date Taking? Authorizing Provider  ?ondansetron (ZOFRAN ODT) 4 MG disintegrating tablet Take 1 tablet (4 mg total) by mouth every 8 (eight) hours as needed for nausea or vomiting. 12/22/20   Vladimir Crofts, MD  ?pantoprazole (PROTONIX) 20 MG tablet Take 1 tablet (20 mg total) by mouth daily. 12/21/21   Milton Ferguson, MD  ?promethazine (PHENERGAN) 25 MG suppository Place 1 suppository (25 mg total) rectally every 6 (six) hours as needed for nausea or vomiting. 12/21/21   Milton Ferguson, MD  ?   ? ?Allergies    ?Patient has no known allergies.   ? ?Review of Systems   ?Review of Systems  ?Constitutional:  Negative for chills and fever.  ?Respiratory:  Negative for shortness of breath.   ?Cardiovascular:  Negative for chest pain.  ?Gastrointestinal:  Positive for abdominal pain, nausea and vomiting. Negative for diarrhea.  ?Genitourinary:  Negative for difficulty urinating and dysuria.  ?Musculoskeletal:  Negative for arthralgias and myalgias.  ?Skin:  Negative for rash.  ?Neurological:  Negative for weakness and numbness.   ? ?Physical Exam ?Updated Vital Signs ?BP (!) 145/87 (BP Location: Left Arm)   Pulse 87   Temp 97.9 ?F (36.6 ?C) (Oral)   Resp 16   Ht 5\' 2"  (1.575 m)   Wt 40.8 kg   LMP 01/06/2022 (Approximate)   SpO2 97%   BMI 16.46 kg/m?  ?Physical Exam ?Vitals and nursing note reviewed.  ?Constitutional:   ?   General: She is not in acute distress. ?   Appearance: Normal appearance. She is not ill-appearing.  ?HENT:  ?   Mouth/Throat:  ?   Mouth: Mucous membranes are dry.  ?   Pharynx: Oropharynx is clear. No oropharyngeal exudate or posterior oropharyngeal erythema.  ?Eyes:  ?   Conjunctiva/sclera: Conjunctivae normal.  ?   Pupils: Pupils are equal, round, and reactive to light.  ?Cardiovascular:  ?   Rate and Rhythm: Normal rate and regular rhythm.  ?   Pulses: Normal pulses.  ?Pulmonary:  ?   Effort: Pulmonary effort is normal. No respiratory distress.  ?Abdominal:  ?   Palpations: Abdomen is soft.  ?   Tenderness: There is no abdominal tenderness.  ?Musculoskeletal:     ?   General: Normal range of motion.  ?   Cervical back: Normal range of motion.  ?   Right lower leg: No edema.  ?   Left lower leg: No edema.  ?Lymphadenopathy:  ?   Cervical: No cervical adenopathy.  ?Skin: ?   General: Skin is warm.  ?  Capillary Refill: Capillary refill takes less than 2 seconds.  ?Neurological:  ?   General: No focal deficit present.  ?   Mental Status: She is alert.  ?   Motor: No weakness.  ? ? ?ED Results / Procedures / Treatments   ?Labs ?(all labs ordered are listed, but only abnormal results are displayed) ?Labs Reviewed  ?URINALYSIS, ROUTINE W REFLEX MICROSCOPIC - Abnormal; Notable for the following components:  ?    Result Value  ? APPearance CLOUDY (*)   ? Specific Gravity, Urine 1.032 (*)   ? Ketones, ur 80 (*)   ? Protein, ur 100 (*)   ? Squamous Epithelial / LPF >50 (*)   ? All other components within normal limits  ?CBC WITH DIFFERENTIAL/PLATELET - Abnormal; Notable for the following components:  ? Hemoglobin 16.2  (*)   ? HCT 46.7 (*)   ? All other components within normal limits  ?COMPREHENSIVE METABOLIC PANEL - Abnormal; Notable for the following components:  ? Potassium 3.2 (*)   ? Chloride 96 (*)   ? Total Protein 9.7 (*)   ? Albumin 5.4 (*)   ? Total Bilirubin 1.5 (*)   ? Anion gap 17 (*)   ? All other components within normal limits  ?PREGNANCY, URINE  ? ? ?EKG ?None ? ?Radiology ?No results found. ? ?Procedures ?Procedures  ? ? ?Medications Ordered in ED ?Medications  ?ondansetron (ZOFRAN-ODT) disintegrating tablet 4 mg (4 mg Oral Given 01/24/22 1123)  ?sodium chloride 0.9 % bolus 1,000 mL (1,000 mLs Intravenous New Bag/Given 01/24/22 1349)  ?metoCLOPramide (REGLAN) injection 10 mg (10 mg Intravenous Given 01/24/22 1349)  ? ? ?ED Course/ Medical Decision Making/ A&P ?  ?                        ?Medical Decision Making ?Patient here for evaluation of intermittent vomiting x3 days.  Notes having history of same.  Was given prescription for antianxiety medicines by PCP but she has not taken them.  Onset of current symptoms after receiving an fraction from her supervisor.  Vomiting has been intermittent and associated with crampy abdominal pain.  No fever chills or dizziness.  No dysuria symptoms or hematemesis. ? ?On exam, patient well-appearing she is nontoxic-appearing.  Vital signs are reassuring.  Mucous membranes are slightly dry.  Possibly related to dehydration.  Will check labs and administer IV fluids.  Antiemetic.  Abdomen is soft without peritoneal signs.  No clinical symptoms to suggest acute abdomen.  I suspect her symptoms are related to cyclic vomiting.  If work-up reassuring, I feel that she will be appropriate for discharge home and prescribed antiemetic. ? ?Amount and/or Complexity of Data Reviewed ?Labs: ordered. ?   Details: Labs interpreted by me, no evidence of leukocytosis.  No anemia.  Chemistries show reassuring kidney function.  Mildly hypokalemic at potassium of 3.2 urinalysis without evidence of  infection.  Urine pregnancy test is negative. ? ?Risk ?Prescription drug management. ? ? ?On recheck, patient has received IV fluids and antiemetic.  She is resting comfortably and states her symptoms have improved.  She reports ready for discharge home.  Requesting refill for antiemetic.  Discussed close follow-up with PCP to have potassium levels rechecked.  She is agreeable to this plan verbalized understanding.  Feel she is appropriate for discharge home and return precautions were discussed ? ? ? ? ? ? ? ?Final Clinical Impression(s) / ED Diagnoses ?Final diagnoses:  ?Nausea and vomiting, unspecified  vomiting type  ? ? ?Rx / DC Orders ?ED Discharge Orders   ? ? None  ? ?  ? ? ?  ?Kem Parkinson, PA-C ?01/26/22 1333 ? ?  ?Fredia Sorrow, MD ?01/29/22 1614 ? ?

## 2022-01-24 NOTE — ED Notes (Signed)
Pt given zofran and an ice pack. ?

## 2022-01-24 NOTE — Discharge Instructions (Signed)
Frequent small sips of fluids then bland diet as tolerated.  Follow-up with your primary care provider for recheck.  Return emergency department for any new or worsening symptoms.  As discussed, your potassium level today was slightly low.  This can be secondary to persistent vomiting.  Try to eat potassium rich foods.  You will need to have your potassium level rechecked by your primary care provider in 1 to 2 weeks. ?

## 2022-08-03 ENCOUNTER — Emergency Department (HOSPITAL_COMMUNITY)
Admission: EM | Admit: 2022-08-03 | Discharge: 2022-08-03 | Disposition: A | Discharge status: Home / Self Care | Payer: Self-pay | Attending: Emergency Medicine | Admitting: Emergency Medicine

## 2022-08-03 ENCOUNTER — Encounter (HOSPITAL_COMMUNITY): Payer: Self-pay

## 2022-08-03 ENCOUNTER — Other Ambulatory Visit: Payer: Self-pay

## 2022-08-03 DIAGNOSIS — R197 Diarrhea, unspecified: Secondary | ICD-10-CM | POA: Insufficient documentation

## 2022-08-03 DIAGNOSIS — Z79899 Other long term (current) drug therapy: Secondary | ICD-10-CM | POA: Insufficient documentation

## 2022-08-03 DIAGNOSIS — R109 Unspecified abdominal pain: Secondary | ICD-10-CM | POA: Insufficient documentation

## 2022-08-03 DIAGNOSIS — R001 Bradycardia, unspecified: Secondary | ICD-10-CM | POA: Insufficient documentation

## 2022-08-03 DIAGNOSIS — R112 Nausea with vomiting, unspecified: Secondary | ICD-10-CM | POA: Insufficient documentation

## 2022-08-03 LAB — CBC
HCT: 40.4 % (ref 36.0–46.0)
Hemoglobin: 13.9 g/dL (ref 12.0–15.0)
MCH: 33 pg (ref 26.0–34.0)
MCHC: 34.4 g/dL (ref 30.0–36.0)
MCV: 96 fL (ref 80.0–100.0)
Platelets: 289 10*3/uL (ref 150–400)
RBC: 4.21 MIL/uL (ref 3.87–5.11)
RDW: 13.2 % (ref 11.5–15.5)
WBC: 12.4 10*3/uL — ABNORMAL HIGH (ref 4.0–10.5)
nRBC: 0 % (ref 0.0–0.2)

## 2022-08-03 LAB — COMPREHENSIVE METABOLIC PANEL
ALT: 21 U/L (ref 0–44)
AST: 30 U/L (ref 15–41)
Albumin: 4.6 g/dL (ref 3.5–5.0)
Alkaline Phosphatase: 41 U/L (ref 38–126)
Anion gap: 11 (ref 5–15)
BUN: 17 mg/dL (ref 6–20)
CO2: 21 mmol/L — ABNORMAL LOW (ref 22–32)
Calcium: 9.4 mg/dL (ref 8.9–10.3)
Chloride: 106 mmol/L (ref 98–111)
Creatinine, Ser: 0.66 mg/dL (ref 0.44–1.00)
GFR, Estimated: >60 mL/min (ref >60)
Glucose, Bld: 165 mg/dL — ABNORMAL HIGH (ref 70–99)
Potassium: 3.7 mmol/L (ref 3.5–5.1)
Sodium: 138 mmol/L (ref 135–145)
Total Bilirubin: 0.5 mg/dL (ref 0.3–1.2)
Total Protein: 8 g/dL (ref 6.5–8.1)

## 2022-08-03 LAB — RAPID URINE DRUG SCREEN, HOSP PERFORMED
Amphetamines: NOT DETECTED
Barbiturates: NOT DETECTED
Benzodiazepines: NOT DETECTED
Cocaine: NOT DETECTED
Opiates: NOT DETECTED
Tetrahydrocannabinol: POSITIVE — AB

## 2022-08-03 LAB — URINALYSIS, ROUTINE W REFLEX MICROSCOPIC
Bilirubin Urine: NEGATIVE
Glucose, UA: 50 mg/dL — AB
Hgb urine dipstick: NEGATIVE
Ketones, ur: 20 mg/dL — AB
Leukocytes,Ua: NEGATIVE
Nitrite: NEGATIVE
Protein, ur: NEGATIVE mg/dL
Specific Gravity, Urine: 1.014 (ref 1.005–1.030)
pH: 8 (ref 5.0–8.0)

## 2022-08-03 LAB — HCG, QUANTITATIVE, PREGNANCY: hCG, Beta Chain, Quant, S: <1 m[IU]/mL (ref <5)

## 2022-08-03 LAB — LIPASE, BLOOD: Lipase: 25 U/L (ref 11–51)

## 2022-08-03 MED ORDER — ONDANSETRON HCL 4 MG/2ML IJ SOLN
4.0000 mg | Freq: Once | INTRAMUSCULAR | Status: AC | PRN
Start: 2022-08-03 — End: 2022-08-03
  Administered 2022-08-03: 4 mg via INTRAVENOUS
  Filled 2022-08-03: qty 2

## 2022-08-03 MED ORDER — PROMETHAZINE HCL 25 MG RE SUPP: 25.0000 mg | Freq: Four times a day (QID) | RECTAL | 0 refills | Status: AC | PRN

## 2022-08-03 MED ORDER — ONDANSETRON HCL 4 MG/2ML IJ SOLN
4.0000 mg | Freq: Once | INTRAMUSCULAR | Status: AC
  Administered 2022-08-03: 4 mg via INTRAVENOUS
  Filled 2022-08-03: qty 2

## 2022-08-03 MED ORDER — PROMETHAZINE HCL 25 MG RE SUPP: 25.0000 mg | Freq: Four times a day (QID) | RECTAL | 0 refills | Status: DC | PRN

## 2022-08-03 MED ORDER — HALOPERIDOL LACTATE 5 MG/ML IJ SOLN
5.0000 mg | Freq: Once | INTRAMUSCULAR | Status: AC
  Administered 2022-08-03: 5 mg via INTRAVENOUS
  Filled 2022-08-03: qty 1

## 2022-08-03 MED ORDER — SODIUM CHLORIDE 0.9 % IV BOLUS
1000.0000 mL | Freq: Once | INTRAVENOUS | Status: AC
  Administered 2022-08-03: 1000 mL via INTRAVENOUS

## 2022-08-03 NOTE — ED Notes (Signed)
Attempting PO challenge at this time. Provided ginger ale and graham crackers

## 2022-08-03 NOTE — ED Notes (Signed)
Pt ambulatory to restroom with standby assist

## 2022-08-03 NOTE — Discharge Instructions (Signed)
You have symptoms likely due to marijuana use.  Please avoid marijuana as it may worsen your symptoms.  Use resource below to seek for help.

## 2022-08-03 NOTE — ED Triage Notes (Signed)
Pt had one syncopal episode during triage that lasted 3-5 sec.

## 2022-08-03 NOTE — ED Provider Notes (Signed)
[facility] EMERGENCY DEPARTMENT Provider Note   CSN: [id number] Arrival date & time: 08/03/22  1149     History  Chief Complaint  Patient presents with   Emesis    Sara Davenport is a 21 y.o. female.  The history is provided by the patient and medical records. No language interpreter was used.  Emesis    21 year old female who has significant history of anxiety who presenting for evaluation of nausea vomiting diarrhea.  Patient report this morning she felt very nauseous, has vomited multiple times dry heaving, as well as having some loose stools.  She endorsed diffuse abdominal cramping.  She is unable to keep anything down.  She is dry heaving.  She reported having similar symptoms like this in the past and was told that it was due to anxiety.  She is takes anxiety medication.  She does not endorse any fever or chills no chest pain or shortness of breath.  She denies alcohol use but does admits to marijuana use.  Home Medications Prior to Admission medications   Medication Sig Start Date End Date Taking? Authorizing Provider  ondansetron (ZOFRAN-ODT) 8 MG disintegrating tablet Take 1 tablet (8 mg total) by mouth every 8 (eight) hours as needed for nausea or vomiting. 01/24/22   Triplett, Tammy, PA-C  pantoprazole (PROTONIX) 20 MG tablet Take 1 tablet (20 mg total) by mouth daily. 12/21/21   Bethann Berkshire, MD  promethazine (PHENERGAN) 25 MG suppository Place 1 suppository (25 mg total) rectally every 6 (six) hours as needed for nausea or vomiting. 12/21/21   Bethann Berkshire, MD      Allergies    Patient has no known allergies.    Review of Systems   Review of Systems  Gastrointestinal:  Positive for vomiting.  All other systems reviewed and are negative.   Physical Exam Updated Vital Signs BP (!) 141/94 (BP Location: Right Arm)   Pulse (!) 54   Temp 97.6 F (36.4 C) (Oral)   Resp (!) 28   Ht 5\' 2"  (1.575 m)   Wt 48.1 kg   LMP 07/28/2022   SpO2 95%   BMI 19.39 kg/m   Physical Exam Vitals and nursing note reviewed.  Constitutional:      Appearance: She is well-developed.     Comments: Patient is laying in the fetal position, actively dry heaving it appears uncomfortable.  HENT:     Head: Atraumatic.  Eyes:     Conjunctiva/sclera: Conjunctivae normal.  Cardiovascular:     Rate and Rhythm: Bradycardia present.  Pulmonary:     Effort: Pulmonary effort is normal.  Abdominal:     Palpations: Abdomen is soft.     Tenderness: There is abdominal tenderness (Diffuse tenderness no guarding or rebound tenderness).  Musculoskeletal:     Cervical back: Neck supple.  Skin:    Findings: No rash.  Neurological:     Mental Status: She is alert.  Psychiatric:        Mood and Affect: Mood normal.     ED Results / Procedures / Treatments   Labs (all labs ordered are listed, but only abnormal results are displayed) Labs Reviewed  COMPREHENSIVE METABOLIC PANEL - Abnormal; Notable for the following components:      Result Value   CO2 21 (*)    Glucose, Bld 165 (*)    All other components within normal limits  CBC - Abnormal; Notable for the following components:   WBC 12.4 (*)    All other components within  normal limits  LIPASE, BLOOD  HCG, QUANTITATIVE, PREGNANCY  URINALYSIS, ROUTINE W REFLEX MICROSCOPIC  RAPID URINE DRUG SCREEN, HOSP PERFORMED    EKG EKG Interpretation  Date/Time:  Sunday August 03 2022 14:42:31 EST Ventricular Rate:  61 PR Interval:  148 QRS Duration: 92 QT Interval:  482 QTC Calculation: 485 R Axis:   100 Text Interpretation: Sinus rhythm with marked sinus arrhythmia Rightward axis Prolonged QT Abnormal ECG No previous ECGs available Confirmed by Vanetta Mulders [phone]) on 08/03/2022 3:42:35 PM  Radiology No results found.  Procedures Procedures    Medications Ordered in ED Medications  ondansetron (ZOFRAN) injection 4 mg (4 mg Intravenous Given 08/03/22 1313)  sodium chloride 0.9 % bolus 1,000 mL (0 mLs  Intravenous Stopped 08/03/22 1550)  haloperidol lactate (HALDOL) injection 5 mg (5 mg Intravenous Given 08/03/22 1428)  ondansetron (ZOFRAN) injection 4 mg (4 mg Intravenous Given 08/03/22 1428)    ED Course/ Medical Decision Making/ A&P                           Medical Decision Making Amount and/or Complexity of Data Reviewed Labs: ordered.  Risk Prescription drug management.   BP (!) 141/94 (BP Location: Right Arm)   Pulse (!) 54   Temp 97.6 F (36.4 C) (Oral)   Resp (!) 28   Ht 5\' 2"  (1.575 m)   Wt 48.1 kg   LMP 07/28/2022   SpO2 95%   BMI 19.39 kg/m   19:62 PM 21 year old female who has significant history of anxiety who presenting for evaluation of nausea vomiting diarrhea.  Patient report this morning she felt very nauseous, has vomited multiple times dry heaving, as well as having some loose stools.  She endorsed diffuse abdominal cramping.  She is unable to keep anything down.  She is dry heaving.  She reported having similar symptoms like this in the past and was told that it was due to anxiety.  She is takes anxiety medication.  She does not endorse any fever or chills no chest pain or shortness of breath.  She denies alcohol use but does admits to marijuana use.  On exam patient is laying in a fetal position appears uncomfortable, holding an emesis bag and actively dry heaving.  She has some mild diffuse abdominal tenderness without focal point tenderness.  Vital signs remarkable for tachypnea with respiratory rate of 28, bradycardia with a heart rate of 54, normal blood pressure, she is afebrile and no hypoxia.  Her presentation is suggestive Cannabinoid hyperemesis syndrome.  It appears patient has been seen.  Presentation in the past.  Work-up initiated.  We will also provide symptomatic treatment.  -Labs ordered, independently viewed and interpreted by me.  Labs remarkable for WBC 12.4, likely stress demargination -The patient was maintained on a cardiac monitor.  I  personally viewed and interpreted the cardiac monitored which showed an underlying rhythm of: NSR with minimally prolonged QT -This patient presents to the ED for concern of nausea/vomiting, this involves an extensive number of treatment options, and is a complaint that carries with it a high risk of complications and morbidity.  The differential diagnosis includes cannabinoid hyperemesis syndrome, gastritis, gastroenteritis, biliary disease, colitis, pancreatitis, cholecystitis -Co morbidities that complicate the patient evaluation includes marijuana use -Treatment includes IVF, zofran, haldol -Reevaluation of the patient after these medicines showed that the patient improved -PCP office notes or outside notes reviewed -Escalation to admission/observation considered: patients feels much better, is comfortable  with discharge, and will follow up with PCP -Prescription medication considered, patient comfortable with zofran -Social Determinant of Health considered which includes marijuana use, recommend cessation  4:01 PM Patient presents with symptoms suggestive of cannabinoid hyperemesis syndrome.  Patient was given symptomatic care and on reassessment she feels better.  Will provide resources to help with marijuana abuse.  Return precaution given.  I have consider advanced imaging but in the setting of improvement of symptoms, prior evaluation for same with normal abdominal CT additional imaging is not indicated at this time.  I have low suspicion for biliary disease therefore ultrasound did not order.           Final Clinical Impression(s) / ED Diagnoses Final diagnoses:  Nausea and vomiting, unspecified vomiting type    Rx / DC Orders ED Discharge Orders          Ordered    promethazine (PHENERGAN) 25 MG suppository  Every 6 hours PRN        08/03/22 1616              Fayrene Helper, PA-C 08/03/22 1617    Vanetta Mulders, MD 08/06/22 [phone]

## 2022-08-03 NOTE — ED Triage Notes (Addendum)
Pt presents with severe vomiting and diarrhea that started today. Per pt's mother pt has had a syncopal episode. Pt with hx of anxiety. Pt noted to be hyperventilating and dry heaving in triage.

## 2022-08-05 ENCOUNTER — Emergency Department
Admission: EM | Admit: 2022-08-05 | Discharge: 2022-08-05 | Disposition: A | Discharge status: Home / Self Care | Payer: Self-pay | Attending: Student in an Organized Health Care Education/Training Program | Admitting: Student in an Organized Health Care Education/Training Program

## 2022-08-05 ENCOUNTER — Emergency Department: Payer: Self-pay

## 2022-08-05 ENCOUNTER — Encounter: Payer: Self-pay | Admitting: Emergency Medicine

## 2022-08-05 ENCOUNTER — Other Ambulatory Visit: Payer: Self-pay

## 2022-08-05 DIAGNOSIS — R112 Nausea with vomiting, unspecified: Secondary | ICD-10-CM | POA: Insufficient documentation

## 2022-08-05 DIAGNOSIS — E860 Dehydration: Secondary | ICD-10-CM | POA: Insufficient documentation

## 2022-08-05 DIAGNOSIS — R1084 Generalized abdominal pain: Secondary | ICD-10-CM | POA: Insufficient documentation

## 2022-08-05 DIAGNOSIS — R55 Syncope and collapse: Secondary | ICD-10-CM | POA: Insufficient documentation

## 2022-08-05 LAB — CBC WITH DIFFERENTIAL/PLATELET
Abs Immature Granulocytes: 0.03 10*3/uL (ref 0.00–0.07)
Basophils Absolute: 0 10*3/uL (ref 0.0–0.1)
Basophils Relative: 0 %
Eosinophils Absolute: 0 10*3/uL (ref 0.0–0.5)
Eosinophils Relative: 0 %
HCT: 41.8 % (ref 36.0–46.0)
Hemoglobin: 14.3 g/dL (ref 12.0–15.0)
Immature Granulocytes: 0 %
Lymphocytes Relative: 11 %
Lymphs Abs: 0.8 10*3/uL (ref 0.7–4.0)
MCH: 32.2 pg (ref 26.0–34.0)
MCHC: 34.2 g/dL (ref 30.0–36.0)
MCV: 94.1 fL (ref 80.0–100.0)
Monocytes Absolute: 0.4 10*3/uL (ref 0.1–1.0)
Monocytes Relative: 5 %
Neutro Abs: 5.8 10*3/uL (ref 1.7–7.7)
Neutrophils Relative %: 84 %
Platelets: 229 10*3/uL (ref 150–400)
RBC: 4.44 MIL/uL (ref 3.87–5.11)
RDW: 13 % (ref 11.5–15.5)
WBC: 7 10*3/uL (ref 4.0–10.5)
nRBC: 0 % (ref 0.0–0.2)

## 2022-08-05 LAB — PREGNANCY, URINE: Preg Test, Ur: NEGATIVE

## 2022-08-05 LAB — URINALYSIS, ROUTINE W REFLEX MICROSCOPIC
Bilirubin Urine: NEGATIVE
Glucose, UA: NEGATIVE mg/dL
Hgb urine dipstick: NEGATIVE
Ketones, ur: 20 mg/dL — AB
Leukocytes,Ua: NEGATIVE
Nitrite: NEGATIVE
Protein, ur: NEGATIVE mg/dL
Specific Gravity, Urine: >1.046 — ABNORMAL HIGH (ref 1.005–1.030)
pH: 7 (ref 5.0–8.0)

## 2022-08-05 LAB — LIPASE, BLOOD: Lipase: 26 U/L (ref 11–51)

## 2022-08-05 LAB — COMPREHENSIVE METABOLIC PANEL
ALT: 22 U/L (ref 0–44)
AST: 38 U/L (ref 15–41)
Albumin: 4.2 g/dL (ref 3.5–5.0)
Alkaline Phosphatase: 33 U/L — ABNORMAL LOW (ref 38–126)
Anion gap: 12 (ref 5–15)
BUN: 17 mg/dL (ref 6–20)
CO2: 23 mmol/L (ref 22–32)
Calcium: 8.5 mg/dL — ABNORMAL LOW (ref 8.9–10.3)
Chloride: 104 mmol/L (ref 98–111)
Creatinine, Ser: 0.88 mg/dL (ref 0.44–1.00)
GFR, Estimated: >60 mL/min (ref >60)
Glucose, Bld: 107 mg/dL — ABNORMAL HIGH (ref 70–99)
Potassium: 3.1 mmol/L — ABNORMAL LOW (ref 3.5–5.1)
Sodium: 139 mmol/L (ref 135–145)
Total Bilirubin: 0.9 mg/dL (ref 0.3–1.2)
Total Protein: 7.7 g/dL (ref 6.5–8.1)

## 2022-08-05 LAB — POC URINE PREG, ED: Preg Test, Ur: NEGATIVE

## 2022-08-05 MED ORDER — POTASSIUM CHLORIDE CRYS ER 20 MEQ PO TBCR
40.0000 meq | EXTENDED_RELEASE_TABLET | Freq: Once | ORAL | Status: AC
  Administered 2022-08-05: 40 meq via ORAL
  Filled 2022-08-05: qty 2

## 2022-08-05 MED ORDER — IOHEXOL 300 MG/ML  SOLN
80.0000 mL | Freq: Once | INTRAMUSCULAR | Status: AC | PRN
  Administered 2022-08-05: 80 mL via INTRAVENOUS

## 2022-08-05 MED ORDER — DROPERIDOL 2.5 MG/ML IJ SOLN
2.5000 mg | Freq: Once | INTRAMUSCULAR | Status: AC
  Administered 2022-08-05: 2.5 mg via INTRAVENOUS
  Filled 2022-08-05: qty 2

## 2022-08-05 MED ORDER — METOCLOPRAMIDE HCL 5 MG/ML IJ SOLN
5.0000 mg | Freq: Once | INTRAMUSCULAR | Status: AC
  Administered 2022-08-05: 5 mg via INTRAVENOUS
  Filled 2022-08-05: qty 2

## 2022-08-05 MED ORDER — ONDANSETRON 4 MG PO TBDP: 4.0000 mg | ORAL_TABLET | Freq: Three times a day (TID) | ORAL | 0 refills | Status: AC | PRN

## 2022-08-05 MED ORDER — POTASSIUM CHLORIDE 10 MEQ/100ML IV SOLN
10.0000 meq | Freq: Once | INTRAVENOUS | Status: AC
  Administered 2022-08-05: 10 meq via INTRAVENOUS
  Filled 2022-08-05: qty 100

## 2022-08-05 MED ORDER — SODIUM CHLORIDE 0.9 % IV BOLUS
1000.0000 mL | Freq: Once | INTRAVENOUS | Status: AC
  Administered 2022-08-05: 1000 mL via INTRAVENOUS

## 2022-08-05 MED ORDER — LACTATED RINGERS IV BOLUS
1000.0000 mL | Freq: Once | INTRAVENOUS | Status: AC
  Administered 2022-08-05: 1000 mL via INTRAVENOUS

## 2022-08-05 NOTE — ED Provider Notes (Signed)
[facility], [place] Provider Note    Event Date/Time   First MD Initiated Contact with Patient 08/05/22 1459     (approximate)   History   Emesis and Loss of Consciousness   HPI  Sara Davenport is a 21 y.o. female who presents to the ER for evaluation of generalized abdominal pain nausea vomiting retching for the past 24 to 48 hours.  Patient seen at [facility] yesterday for similar symptoms was given antiemetics and fluids with some improvement but patient persists with nausea vomiting.  Describing crampy abdominal pain periumbilical in nature.     Physical Exam   Triage Vital Signs: ED Triage Vitals  Enc Vitals Group     BP 08/05/22 1419 (!) 142/100     Pulse Rate 08/05/22 1419 77     Resp 08/05/22 1419 16     Temp 08/05/22 1419 98.3 F (36.8 C)     Temp Source 08/05/22 1419 Oral     SpO2 08/05/22 1419 98 %     Weight 08/05/22 1420 106 lb (48.1 kg)     Height 08/05/22 1420 5\' 2"  (1.575 m)     Head Circumference --      Peak Flow --      Pain Score 08/05/22 1420 6     Pain Loc --      Pain Edu? --      Excl. in GC? --     Most recent vital signs: Vitals:   08/05/22 1700 08/05/22 2100  BP: (!) 132/90 110/88  Pulse: 78 86  Resp: 14 14  Temp: 98.4 F (36.9 C) 98.4 F (36.9 C)  SpO2: 98% 98%     Constitutional: Alert  Eyes: Conjunctivae are normal.  Head: Atraumatic. Nose: No congestion/rhinnorhea. Mouth/Throat: Mucous membranes are moist.   Neck: Painless ROM.  Cardiovascular:   Good peripheral circulation. Respiratory: Normal respiratory effort.  No retractions.  Gastrointestinal: Soft and nontender.  Musculoskeletal:  no deformity Neurologic:  MAE spontaneously. No gross focal neurologic deficits are appreciated.  Skin:  Skin is warm, dry and intact. No rash noted. Psychiatric: Mood and affect are normal. Speech and behavior are normal.    ED Results / Procedures / Treatments   Labs (all labs ordered are listed, but only  abnormal results are displayed) Labs Reviewed  COMPREHENSIVE METABOLIC PANEL - Abnormal; Notable for the following components:      Result Value   Potassium 3.1 (*)    Glucose, Bld 107 (*)    Calcium 8.5 (*)    Alkaline Phosphatase 33 (*)    All other components within normal limits  URINALYSIS, ROUTINE W REFLEX MICROSCOPIC - Abnormal; Notable for the following components:   Color, Urine STRAW (*)    APPearance CLEAR (*)    Specific Gravity, Urine >1.046 (*)    Ketones, ur 20 (*)    All other components within normal limits  CBC WITH DIFFERENTIAL/PLATELET  LIPASE, BLOOD  PREGNANCY, URINE  POC URINE PREG, ED     EKG  ED ECG REPORT I, 08/07/22, the attending physician, personally viewed and interpreted this ECG.   Date: 08/05/2022  EKG Time: 14:23  Rate: 70  Rhythm: sinus  Axis: right  Intervals:normal qt  ST&T Change: non sepcific t wave abn, no stemi    RADIOLOGY Please see ED Course for my review and interpretation.  I personally reviewed all radiographic images ordered to evaluate for the above acute complaints and reviewed radiology reports and findings.  These findings were personally discussed with the patient.  Please see medical record for radiology report.    PROCEDURES:  Critical Care performed: No  Procedures   MEDICATIONS ORDERED IN ED: Medications  sodium chloride 0.9 % bolus 1,000 mL (0 mLs Intravenous Stopped 08/05/22 1631)  droperidol (INAPSINE) 2.5 MG/ML injection 2.5 mg (2.5 mg Intravenous Given 08/05/22 1500)  iohexol (OMNIPAQUE) 300 MG/ML solution 80 mL (80 mLs Intravenous Contrast Given 08/05/22 1532)  potassium chloride SA (KLOR-CON M) CR tablet 40 mEq (40 mEq Oral Given 08/05/22 1651)  potassium chloride 10 mEq in 100 mL IVPB (0 mEq Intravenous Stopped 08/05/22 1814)  lactated ringers bolus 1,000 mL (0 mLs Intravenous Stopped 08/05/22 1850)  metoCLOPramide (REGLAN) injection 5 mg (5 mg Intravenous Given 08/05/22 1825)      IMPRESSION / MDM / ASSESSMENT AND PLAN / ED COURSE  I reviewed the triage vital signs and the nursing notes.                              Differential diagnosis includes, but is not limited to, cyclic vomiting syndrome, hyperemesis, IBS, appendicitis, colitis, gastritis, pancreatitis  Patient presenting to the ER for evaluation of symptoms as described above.  Based on symptoms, risk factors and considered above differential, this presenting complaint could reflect a potentially life-threatening illness therefore the patient will be placed on continuous pulse oximetry and telemetry for monitoring.  Laboratory evaluation will be sent to evaluate for the above complaints.  We will give IV fluids.  Given persistent vomiting and THC positive test result from yesterday we will give droperidol as high suspicion for cyclic vomiting syndrome however the patient has been having symptoms greater than 24 hours now with abdominal pain CT imaging will be ordered for the above differential.    Clinical Course as of 08/05/22 2127  Tue Aug 05, 2022  1550 CT imaging on my review and interpretation does not show any evidence of obstructive pattern. [PR]  1653 Patient reassessed.  She appears clinically improved after IV fluids. [PR]  2126 Patient requesting discharge.  She is reassessed she appears nontoxic-appearing well-perfused she is tolerating p.o.  This point I do believe she stable and appropriate for outpatient follow-up. [PR]    Clinical Course User Index [PR] Willy Eddy, MD    FINAL CLINICAL IMPRESSION(S) / ED DIAGNOSES   Final diagnoses:  Dehydration  Nausea and vomiting, unspecified vomiting type     Rx / DC Orders   ED Discharge Orders          Ordered    ondansetron (ZOFRAN-ODT) 4 MG disintegrating tablet  Every 8 hours PRN        08/05/22 2124             Note:  This document was prepared using Dragon voice recognition software and may include unintentional  dictation errors.    Willy Eddy, MD 08/05/22 2127

## 2022-08-05 NOTE — ED Triage Notes (Signed)
Patient c/o abdominal pain and emesis over the past few days. Significant other reports syncopal episode in waiting room. Patient c/o feeling very hot. States she was at hospital yesterday and given IV fluids.

## 2022-11-28 ENCOUNTER — Other Ambulatory Visit: Payer: Self-pay

## 2022-11-28 ENCOUNTER — Emergency Department
Admission: EM | Admit: 2022-11-28 | Discharge: 2022-11-28 | Disposition: A | Discharge status: Home / Self Care | Payer: Self-pay | Attending: Emergency Medicine | Admitting: Emergency Medicine

## 2022-11-28 DIAGNOSIS — R1115 Cyclical vomiting syndrome unrelated to migraine: Secondary | ICD-10-CM | POA: Insufficient documentation

## 2022-11-28 DIAGNOSIS — D72829 Elevated white blood cell count, unspecified: Secondary | ICD-10-CM | POA: Insufficient documentation

## 2022-11-28 DIAGNOSIS — E860 Dehydration: Secondary | ICD-10-CM | POA: Insufficient documentation

## 2022-11-28 DIAGNOSIS — R1084 Generalized abdominal pain: Secondary | ICD-10-CM | POA: Insufficient documentation

## 2022-11-28 LAB — COMPREHENSIVE METABOLIC PANEL
ALT: 19 U/L (ref 0–44)
AST: 35 U/L (ref 15–41)
Albumin: 4.8 g/dL (ref 3.5–5.0)
Alkaline Phosphatase: 41 U/L (ref 38–126)
Anion gap: 19 — ABNORMAL HIGH (ref 5–15)
BUN: 19 mg/dL (ref 6–20)
CO2: 19 mmol/L — ABNORMAL LOW (ref 22–32)
Calcium: 9.8 mg/dL (ref 8.9–10.3)
Chloride: 100 mmol/L (ref 98–111)
Creatinine, Ser: 0.79 mg/dL (ref 0.44–1.00)
GFR, Estimated: >60 mL/min (ref >60)
Glucose, Bld: 139 mg/dL — ABNORMAL HIGH (ref 70–99)
Potassium: 3.2 mmol/L — ABNORMAL LOW (ref 3.5–5.1)
Sodium: 138 mmol/L (ref 135–145)
Total Bilirubin: 0.8 mg/dL (ref 0.3–1.2)
Total Protein: 8.5 g/dL — ABNORMAL HIGH (ref 6.5–8.1)

## 2022-11-28 LAB — URINALYSIS, ROUTINE W REFLEX MICROSCOPIC
Bilirubin Urine: NEGATIVE
Glucose, UA: NEGATIVE mg/dL
Hgb urine dipstick: NEGATIVE
Ketones, ur: 20 mg/dL — AB
Leukocytes,Ua: NEGATIVE
Nitrite: NEGATIVE
Protein, ur: >=300 mg/dL — AB
Specific Gravity, Urine: 1.037 — ABNORMAL HIGH (ref 1.005–1.030)
pH: 5 (ref 5.0–8.0)

## 2022-11-28 LAB — LIPASE, BLOOD: Lipase: 25 U/L (ref 11–51)

## 2022-11-28 LAB — CBC
HCT: 41.8 % (ref 36.0–46.0)
Hemoglobin: 14.4 g/dL (ref 12.0–15.0)
MCH: 32.1 pg (ref 26.0–34.0)
MCHC: 34.4 g/dL (ref 30.0–36.0)
MCV: 93.3 fL (ref 80.0–100.0)
Platelets: 294 10*3/uL (ref 150–400)
RBC: 4.48 MIL/uL (ref 3.87–5.11)
RDW: 12.7 % (ref 11.5–15.5)
WBC: 13.1 10*3/uL — ABNORMAL HIGH (ref 4.0–10.5)
nRBC: 0 % (ref 0.0–0.2)

## 2022-11-28 LAB — POC URINE PREG, ED: Preg Test, Ur: NEGATIVE

## 2022-11-28 MED ORDER — PROCHLORPERAZINE 25 MG RE SUPP: 25.0000 mg | Freq: Two times a day (BID) | RECTAL | 0 refills | Status: AC | PRN

## 2022-11-28 MED ORDER — METOCLOPRAMIDE HCL 10 MG PO TABS: 10.0000 mg | ORAL_TABLET | Freq: Three times a day (TID) | ORAL | 0 refills | Status: AC | PRN

## 2022-11-28 MED ORDER — PROCHLORPERAZINE EDISYLATE 10 MG/2ML IJ SOLN
5.0000 mg | Freq: Once | INTRAMUSCULAR | Status: AC
  Administered 2022-11-28: 5 mg via INTRAVENOUS
  Filled 2022-11-28: qty 2

## 2022-11-28 MED ORDER — LACTATED RINGERS IV BOLUS
1000.0000 mL | Freq: Once | INTRAVENOUS | Status: AC
  Administered 2022-11-28: 1000 mL via INTRAVENOUS

## 2022-11-28 MED ORDER — DIPHENHYDRAMINE HCL 50 MG/ML IJ SOLN
12.5000 mg | Freq: Once | INTRAMUSCULAR | Status: AC
  Administered 2022-11-28: 12.5 mg via INTRAVENOUS
  Filled 2022-11-28: qty 1

## 2022-11-28 MED ORDER — DROPERIDOL 2.5 MG/ML IJ SOLN
2.5000 mg | Freq: Once | INTRAMUSCULAR | Status: AC
  Administered 2022-11-28: 2.5 mg via INTRAVENOUS
  Filled 2022-11-28: qty 2

## 2022-11-28 MED ORDER — ONDANSETRON HCL 4 MG/2ML IJ SOLN: 4.0000 mg | Freq: Once | INTRAMUSCULAR | Status: DC

## 2022-11-28 NOTE — ED Provider Notes (Signed)
[facility] Provider Note    Event Date/Time   First MD Initiated Contact with Patient 11/28/22 0940     (approximate)   History   Abdominal Pain   HPI  Sara Davenport is a 22 y.o. female with history of recurrent nausea and vomiting here with nausea, vomiting.  The patient states that for the last 2 days, she has had severe, unrelenting, nausea and vomiting.  She been able to eat or drink.  Denies known specific trigger although she states it could have been the [employer] that she ate.  She been unable to keep anything down.  She has been trying Zofran at home.  She has a history of recurrent ED visits for the same over several months.  She does smoke marijuana, but states she had cut this back after being told that it was possibly contributing.  Denies any fevers or chills.  No urinary symptoms.     Physical Exam   Triage Vital Signs: ED Triage Vitals  Enc Vitals Group     BP 11/28/22 0927 (!) 141/117     Pulse Rate 11/28/22 0927 (!) 130     Resp 11/28/22 0927 19     Temp 11/28/22 0927 98 F (36.7 C)     Temp src --      SpO2 11/28/22 0927 96 %     Weight --      Height --      Head Circumference --      Peak Flow --      Pain Score 11/28/22 0926 10     Pain Loc --      Pain Edu? --      Excl. in [place]? --     Most recent vital signs: Vitals:   11/28/22 1300 11/28/22 1315  BP: (!) 138/92   Pulse: (!) 102 90  Resp: (!) 23 20  Temp:    SpO2: 97% 99%     General: Awake, no distress.  CV:  Good peripheral perfusion.  Tachycardic. Resp:  Normal work of breathing.  Abd:  No distention.  Mild diffuse tenderness, worse with flexion of the abdomen.  No rebound or guarding.  No distention.  No peritonitis. Other:  Mildly dry mucous membranes.   ED Results / Procedures / Treatments   Labs (all labs ordered are listed, but only abnormal results are displayed) Labs Reviewed  COMPREHENSIVE METABOLIC PANEL - Abnormal; Notable for the following  components:      Result Value   Potassium 3.2 (*)    CO2 19 (*)    Glucose, Bld 139 (*)    Total Protein 8.5 (*)    Anion gap 19 (*)    All other components within normal limits  CBC - Abnormal; Notable for the following components:   WBC 13.1 (*)    All other components within normal limits  URINALYSIS, ROUTINE W REFLEX MICROSCOPIC - Abnormal; Notable for the following components:   Color, Urine AMBER (*)    APPearance CLOUDY (*)    Specific Gravity, Urine 1.037 (*)    Ketones, ur 20 (*)    Protein, ur >=300 (*)    Bacteria, UA RARE (*)    All other components within normal limits  LIPASE, BLOOD  POC URINE PREG, ED     EKG Normal sinus rhythm ventricular rate 81.  PR 148, QRS 94, QTc 436.  No acute ST elevations or depressions.  No EKG evidence of acute ischemia or infarct.  RADIOLOGY    I also independently reviewed and agree with radiologist interpretations.   PROCEDURES:  Critical Care performed: No  .1-3 Lead EKG Interpretation  Performed by: Duffy Bruce, MD Authorized by: Duffy Bruce, MD     Interpretation: normal     ECG rate:  70-90   ECG rate assessment: normal     Rhythm: sinus rhythm     Ectopy: none     Conduction: normal   Comments:     Indication: Weakness     MEDICATIONS ORDERED IN ED: Medications  lactated ringers bolus 1,000 mL (0 mLs Intravenous Stopped 11/28/22 1100)  droperidol (INAPSINE) 2.5 MG/ML injection 2.5 mg (2.5 mg Intravenous Given 11/28/22 0958)  lactated ringers bolus 1,000 mL (0 mLs Intravenous Stopped 11/28/22 1200)  prochlorperazine (COMPAZINE) injection 5 mg (5 mg Intravenous Given 11/28/22 1258)  diphenhydrAMINE (BENADRYL) injection 12.5 mg (12.5 mg Intravenous Given 11/28/22 1257)     IMPRESSION / MDM / ASSESSMENT AND PLAN / ED COURSE  I reviewed the triage vital signs and the nursing notes.                              Differential diagnosis includes, but is not limited to, cyclical vomiting, CHS,  food-borne illness, viral gastroenteritis, gastritis  Patient's presentation is most consistent with acute presentation with potential threat to life or bodily function.  The patient is on the cardiac monitor to evaluate for evidence of arrhythmia and/or significant heart rate changes  22 year old female here with nausea and vomiting. Suspect cyclical vomiting versus cannabis hyperemesis syndrome. H/o similar episodes in the past. Pt is HDS, well appearing with no focal TTP on abdominal exam. CBC shows mild leukocytosis. CMP unremarkable with exception of mild dehydration. UA contaminated but she has no urinary sx, does show dehydration. IVF, droperidol given with significant improvement and pt is well appearing, tolerating PO after treatment in ED. Discussed the importance of stopping THC use, and will rx compazine as Zofran has not helped her in the past. Return precautions given.      FINAL CLINICAL IMPRESSION(S) / ED DIAGNOSES   Final diagnoses:  Cyclical vomiting     Rx / DC Orders   ED Discharge Orders          Ordered    prochlorperazine (COMPAZINE) 25 MG suppository  Every 12 hours PRN        11/28/22 1303    metoCLOPramide (REGLAN) 10 MG tablet  Every 8 hours PRN        11/28/22 1303             Note:  This document was prepared using Dragon voice recognition software and may include unintentional dictation errors.   Duffy Bruce, MD 11/28/22 2031

## 2022-11-28 NOTE — ED Notes (Signed)
Pt verbalizes understanding of discharge instructions. Opportunity for questioning and answers were provided. Pt discharged from ED to home with significant other. Pt tolerating fluids at time of discharge

## 2022-11-28 NOTE — ED Triage Notes (Signed)
Pt comes with c/o vomiting and belly pain for two days now. Pt states hx of this in past and gets dehydrated. Pt states she feels really hot too. Pt takes meds for anxiety and had recent med switch  of birth control from pill to patch.

## 2022-11-29 ENCOUNTER — Telehealth: Payer: Self-pay | Admitting: Emergency Medicine

## 2022-11-29 NOTE — Telephone Encounter (Signed)
Pt friend called stating that the pharmacy did not have her prescriptions. This RN called pharmacy and spoke with pharmacist. Pharmacist confirmed that they did receive RXs, However, they are unable to fill the compazine at this time due to them not having it in stock.

## 2023-06-23 IMAGING — CT CT ABD-PELV W/ CM
2 of 4 series · 16 of 46 positions shown, 18 images · IV contrast (agent unspecified)
Comparison: None.

CLINICAL DATA: Abdominal pain, acute, nonlocalized

EXAM:
CT ABDOMEN AND PELVIS WITH CONTRAST
TECHNIQUE: Multidetector CT imaging of the abdomen and pelvis was performed
using the standard protocol following bolus administration of
intravenous contrast.

[Series 2: axial st · axial · 0.54mm/px · z∈[+883,+1243]mm · 13 of 82 slices shown, 15 images]
[im 7/82  soft-tissue]
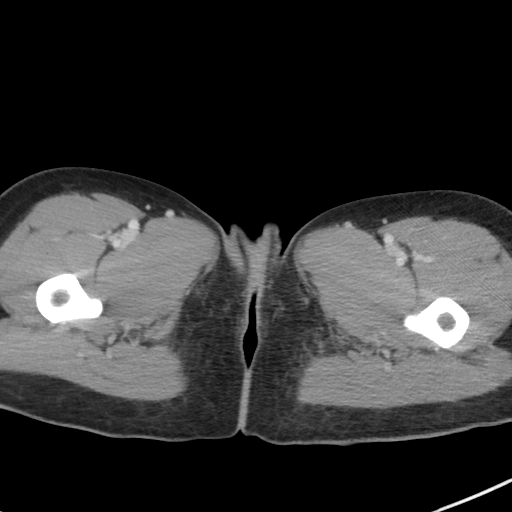
[im 7/82  bone]
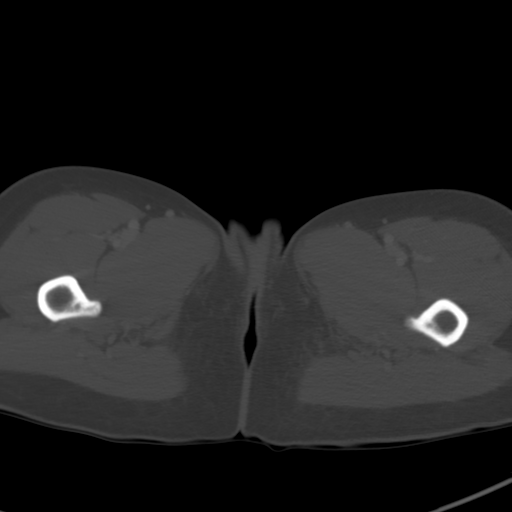
[im 13/82  soft-tissue]
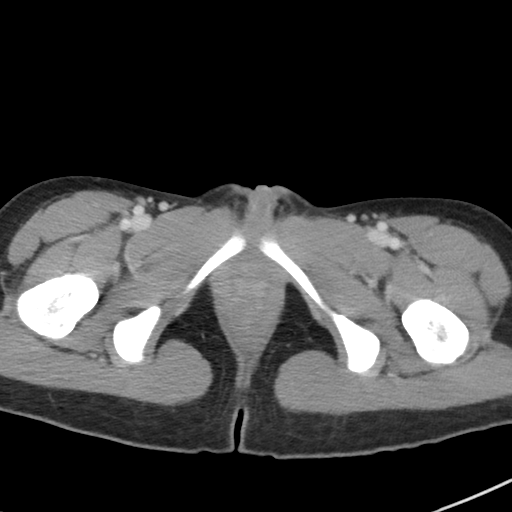
[im 19/82  soft-tissue]
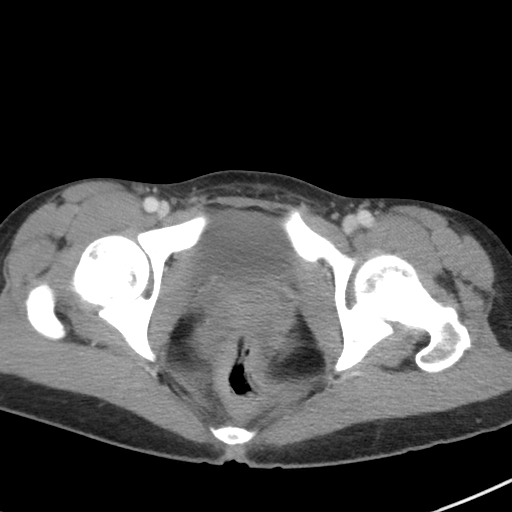
[im 25/82  soft-tissue]
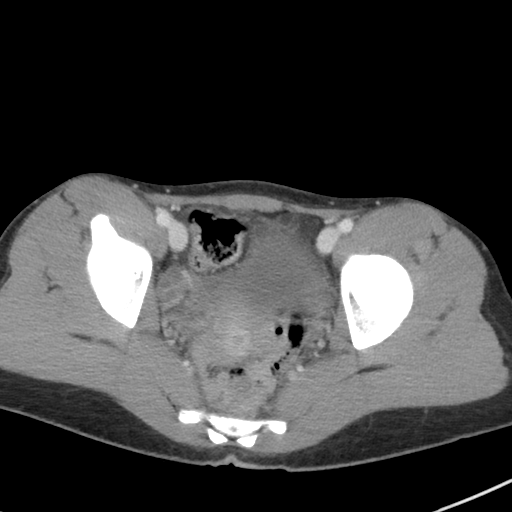
[im 31/82  soft-tissue]
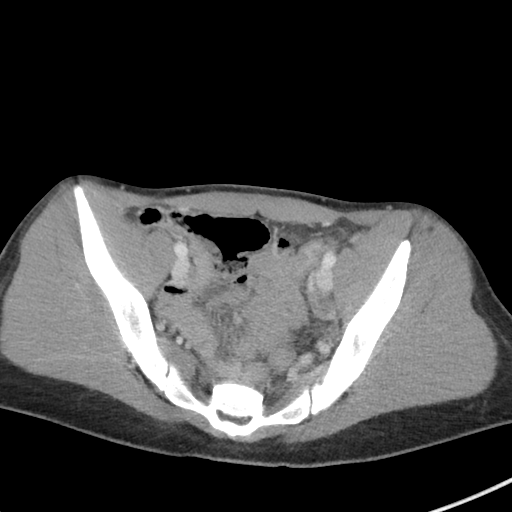
[im 37/82  soft-tissue]
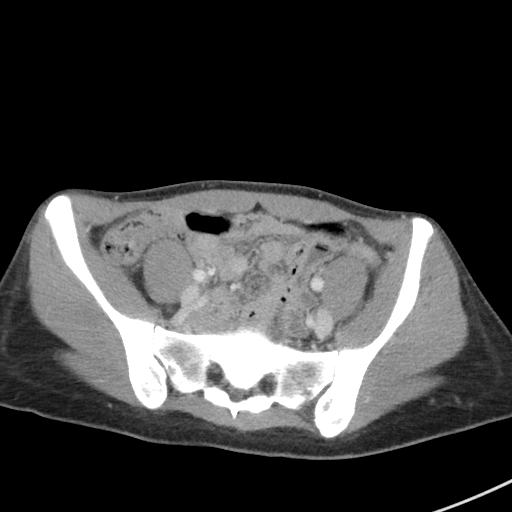
[im 43/82  soft-tissue]
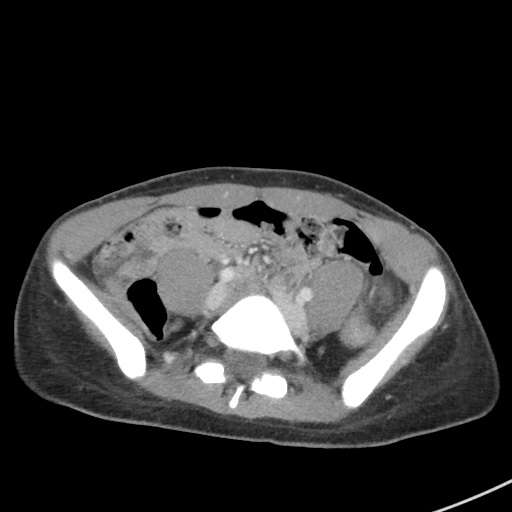
[im 49/82  soft-tissue]
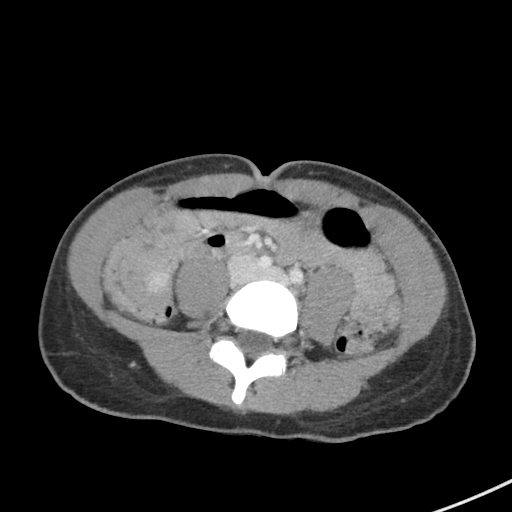
[im 55/82  soft-tissue]
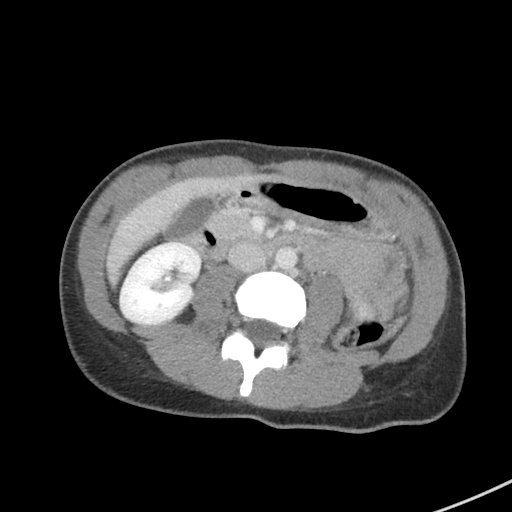
[im 55/82  bone]
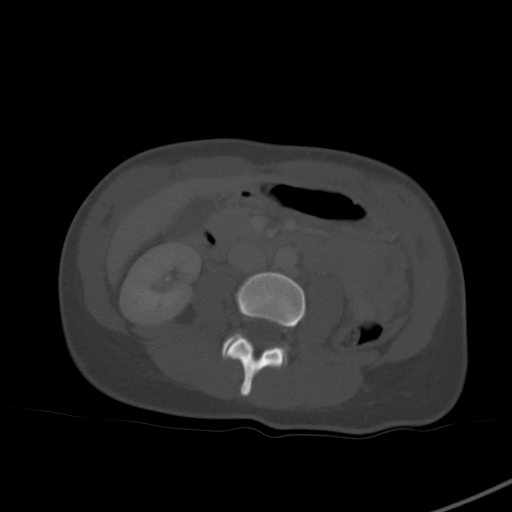
[im 61/82  soft-tissue]
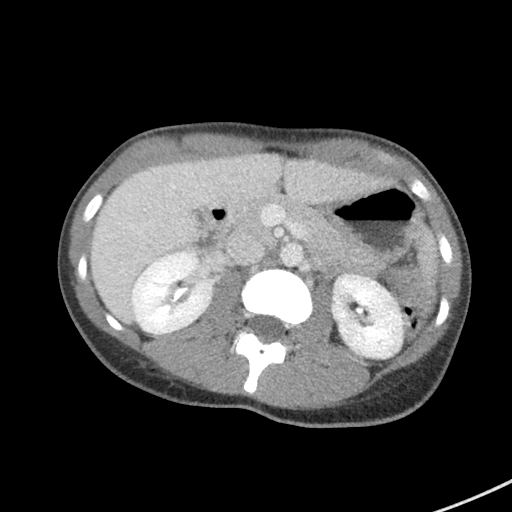
[im 67/82  soft-tissue]
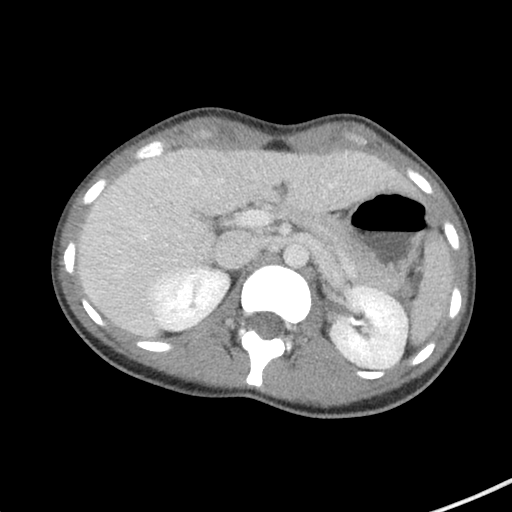
[im 73/82  soft-tissue]
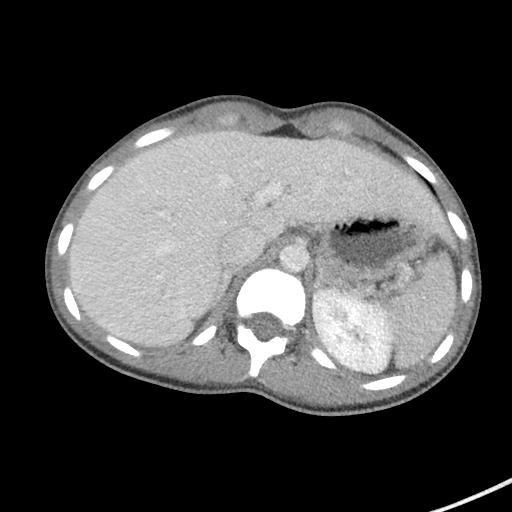
[im 79/82  soft-tissue]
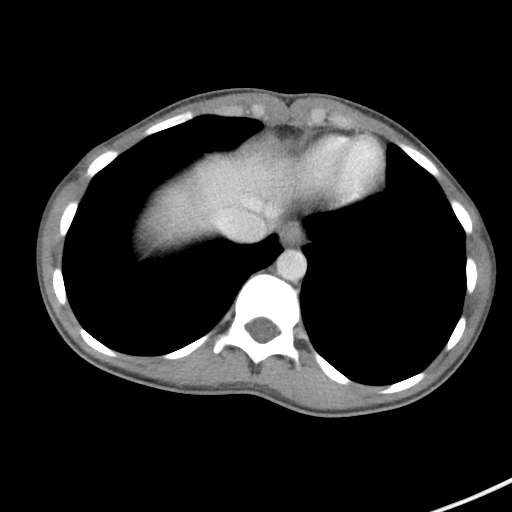

[Series 5: coronal st · coronal · 0.60mm/px · 3 of 76 slices shown]
[im 26/76  soft-tissue]
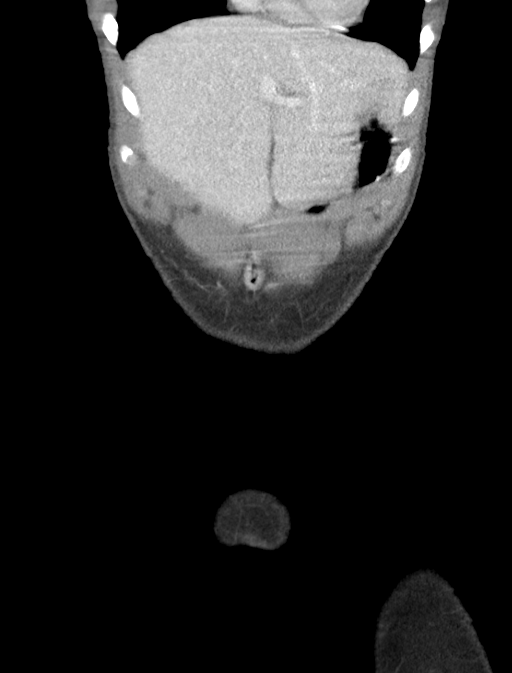
[im 34/76  soft-tissue]
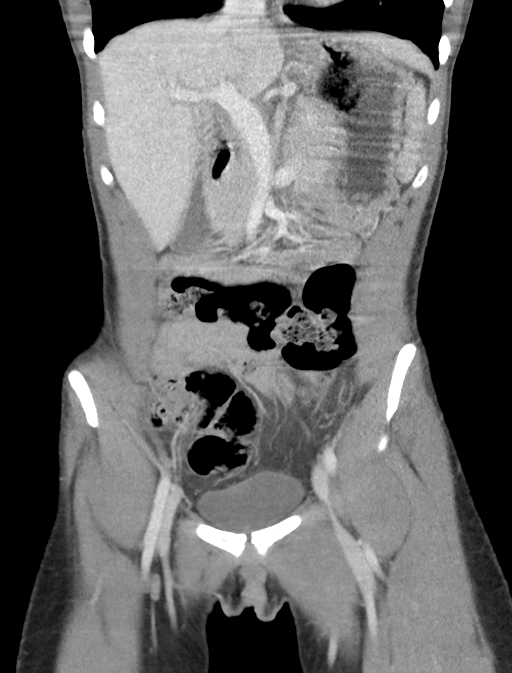
[im 42/76  soft-tissue]
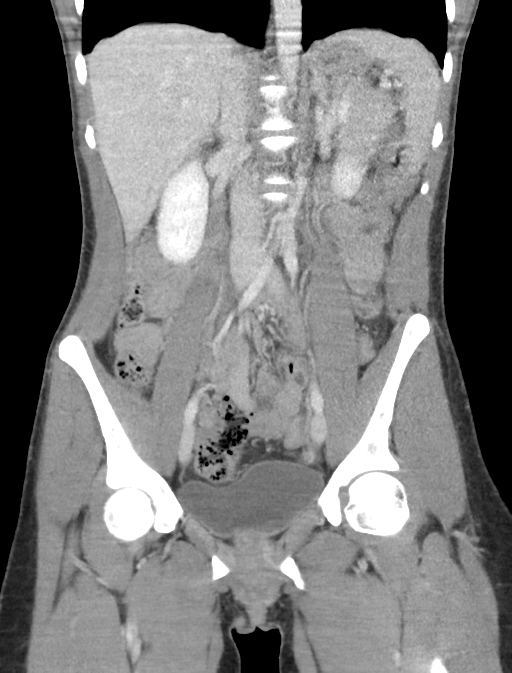

[16 of 46 positions shown; findings below may reference images not displayed]

RADIATION DOSE REDUCTION: This exam was performed according to the
departmental dose-optimization program which includes automated
exposure control, adjustment of the mA and/or kV according to
patient size and/or use of iterative reconstruction technique.

CONTRAST:  75mL OMNIPAQUE IOHEXOL 300 MG/ML  SOLN
FINDINGS: Lower chest: No acute abnormality.

Hepatobiliary: No focal liver abnormality. No gallstones,
gallbladder wall thickening, or pericholecystic fluid. No biliary
dilatation.

Pancreas: No focal lesion. Normal pancreatic contour. No surrounding
inflammatory changes. No main pancreatic ductal dilatation.

Spleen: Normal in size without focal abnormality.

Adrenals/Urinary Tract:

No adrenal nodule bilaterally.

Bilateral kidneys enhance symmetrically.

No hydronephrosis. No hydroureter.  No nephroureterolithiasis.

The urinary bladder is unremarkable.

Stomach/Bowel: Stomach is within normal limits. No evidence of bowel
wall thickening or dilatation. The appendix is not definitely
identified with no inflammatory changes in the right lower quadrant
to suggest acute appendicitis.

Vascular/Lymphatic: No abdominal aorta or iliac aneurysm. No
abdominal, pelvic, or inguinal lymphadenopathy.

Reproductive: Uterus and bilateral adnexa are unremarkable.

Other: No intraperitoneal free fluid. No intraperitoneal free gas.
No organized fluid collection.

Musculoskeletal:

No abdominal wall hernia or abnormality.

No suspicious lytic or blastic osseous lesions. No acute displaced
fracture.
IMPRESSION: The appendix is not definitely identified with no inflammatory
changes in the right lower quadrant to suggest acute appendicitis.
No acute intra-abdominal or intrapelvic abnormality.
